# Patient Record
Sex: Male | Born: 1985
Health system: Southern US, Community
[De-identification: ages and names within clinical notes are randomized; demographics above are authoritative.]

## PROBLEM LIST (undated history)

## (undated) DIAGNOSIS — K219 Gastro-esophageal reflux disease without esophagitis: Secondary | ICD-10-CM

## (undated) DIAGNOSIS — R011 Cardiac murmur, unspecified: Secondary | ICD-10-CM

## (undated) HISTORY — DX: Cardiac murmur, unspecified: R01.1

## (undated) HISTORY — PX: CARDIAC SURGERY: SHX584

---
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
## 2011-09-21 ENCOUNTER — Ambulatory Visit (INDEPENDENT_AMBULATORY_CARE_PROVIDER_SITE_OTHER): Payer: 59

## 2011-09-21 DIAGNOSIS — R1084 Generalized abdominal pain: Secondary | ICD-10-CM

## 2011-09-21 DIAGNOSIS — Z23 Encounter for immunization: Secondary | ICD-10-CM

## 2011-09-21 DIAGNOSIS — N476 Balanoposthitis: Secondary | ICD-10-CM

## 2012-02-14 ENCOUNTER — Ambulatory Visit (INDEPENDENT_AMBULATORY_CARE_PROVIDER_SITE_OTHER): Payer: 59 | Admitting: Emergency Medicine

## 2012-02-14 VITALS — BP 118/81 | HR 77 | Temp 98.3°F | Resp 17 | Ht 64.5 in | Wt 277.0 lb

## 2012-02-14 DIAGNOSIS — N476 Balanoposthitis: Secondary | ICD-10-CM

## 2012-02-14 DIAGNOSIS — Z9889 Other specified postprocedural states: Secondary | ICD-10-CM

## 2012-02-14 DIAGNOSIS — Z8774 Personal history of (corrected) congenital malformations of heart and circulatory system: Secondary | ICD-10-CM | POA: Insufficient documentation

## 2012-02-14 DIAGNOSIS — N481 Balanitis: Secondary | ICD-10-CM

## 2012-02-14 MED ORDER — KETOCONAZOLE 2 % EX CREA: TOPICAL_CREAM | CUTANEOUS | Status: DC

## 2012-02-14 NOTE — Progress Notes (Signed)
  Subjective:    Patient ID: Jonathan Cunningham, male    DOB: Feb 10, 1986, 26 y.o.   MRN: [id number]  HPI patient enters with this recurrent rash in his foreskin that did not respond to Lotrimin cream.    Review of Systems he has no history of diabetes     Objective:   Physical Exam he is an uncircumcised male. His foreskin is thickened and scaly and cracked.  KOH was positive for hyphae      Assessment & Plan:  Patient with significant balanitis. We'll alternate use of nizoral cream with Lubriderm.

## 2013-02-12 ENCOUNTER — Ambulatory Visit (INDEPENDENT_AMBULATORY_CARE_PROVIDER_SITE_OTHER): Payer: BC Managed Care – PPO | Admitting: Emergency Medicine

## 2013-02-12 VITALS — BP 111/73 | HR 71 | Temp 98.7°F | Resp 16 | Ht 65.5 in | Wt 283.0 lb

## 2013-02-12 DIAGNOSIS — Z23 Encounter for immunization: Secondary | ICD-10-CM

## 2013-02-12 DIAGNOSIS — H6993 Unspecified Eustachian tube disorder, bilateral: Secondary | ICD-10-CM

## 2013-02-12 DIAGNOSIS — Z0000 Encounter for general adult medical examination without abnormal findings: Secondary | ICD-10-CM

## 2013-02-12 MED ORDER — AZELASTINE HCL 0.1 % NA SOLN: 2.0000 | Freq: Two times a day (BID) | NASAL | Status: DC

## 2013-02-12 MED ORDER — FLUTICASONE PROPIONATE 50 MCG/ACT NA SUSP: 2.0000 | Freq: Every day | NASAL | Status: DC

## 2013-02-12 NOTE — Progress Notes (Signed)
  Subjective:    Patient ID: Jonathan Cunningham, male    DOB: [birth year], 27 y.o.   MRN: [id number]  HPI 27 year old presents with bilateral ear fullness, itching decreased hearing x 5-6 months; progressively worsening; patient assumed it was allergy related when patient uses Q-tips he sees blood on cotton.     Review of Systems     Objective:   Physical Exam is slight amount of fluid in the right middle ear. There is scarring of the drum. Examination of the left ear reveals a previous adipose patch. Nose is normal throat clear chest clear to        Assessment & Plan:  We'll treat as eustachian tube dysfunction. He is to be on Flonase and Astelin. He will continue his Zyrtec one a day. He is to let him know if he continues to have problems

## 2013-10-07 ENCOUNTER — Ambulatory Visit: Payer: BC Managed Care – PPO

## 2013-10-07 ENCOUNTER — Ambulatory Visit (INDEPENDENT_AMBULATORY_CARE_PROVIDER_SITE_OTHER): Payer: BC Managed Care – PPO | Admitting: Emergency Medicine

## 2013-10-07 VITALS — BP 120/70 | HR 77 | Temp 98.1°F | Resp 16 | Ht 64.5 in | Wt 286.0 lb

## 2013-10-07 DIAGNOSIS — M79609 Pain in unspecified limb: Secondary | ICD-10-CM

## 2013-10-07 DIAGNOSIS — M25571 Pain in right ankle and joints of right foot: Secondary | ICD-10-CM

## 2013-10-07 DIAGNOSIS — M79671 Pain in right foot: Secondary | ICD-10-CM

## 2013-10-07 DIAGNOSIS — M25579 Pain in unspecified ankle and joints of unspecified foot: Secondary | ICD-10-CM

## 2013-10-07 DIAGNOSIS — Z23 Encounter for immunization: Secondary | ICD-10-CM

## 2013-10-07 NOTE — Progress Notes (Addendum)
   Subjective:    Patient ID: Jonathan Cunningham, male    DOB: 12/10/1985, 28 y.o.   MRN: [id number]  HPI This chart was scribed for Lesle ChrisSteven Kionna Brier, MD by Andrew Auaven Small, ED Scribe. This patient was seen in room 5 and the patient's care was started at 10:45 AM.  HPI Comments: Jonathan Cunningham is a 28 y.o. male who presents to the [facility] complaining of inner right ankle pain onset 1 month. Pt states that the pain worsens when he stands on his feet. Pt denies gout and swollen toes. 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 He reports that he has been working this job for about 4 weeks and he has been exerting himself more than usual. Pt has just purchased a new pair of work shoes and reports that he has been wearing an ankle brace with minor relief.  Pt is also requesting a flu shot.   Past Medical History  Diagnosis Date  . Heart murmur    No Known Allergies Prior to Admission medications   Medication Sig Start Date End Date Taking? Authorizing Provider  azelastine (ASTELIN) 137 MCG/SPRAY nasal spray Place 2 sprays into the nose 2 (two) times daily. Use in each nostril as directed 02/12/13   Collene GobbleSteven A Zahira Brummond, MD  fluticasone (FLONASE) 50 MCG/ACT nasal spray Place 2 sprays into the nose daily. 02/12/13   Collene GobbleSteven A Chanoch Mccleery, MD  ketoconazole (NIZORAL) 2 % cream Apply to the foreskin twice a day 02/14/12   Collene GobbleSteven A Jeffren Dombek, MD   Review of Systems  Musculoskeletal: Positive for arthralgias ( right ankle).  Skin: Negative for rash and wound.       Objective:   Physical Exam CONSTITUTIONAL: Well developed/well nourished HEAD: Normocephalic/atraumatic EYES: EOMI/PERRL ENMT: Mucous membranes moist NEURO: Pt is awake/alert, moves all extremitiesx4 EXTREMITIES: pulses normal, full ROM there is tenderness over the dorsum of the right foot. There is pain with inversion but primarily pain with dorsi flexion. Pulses are 2+ and symmetrical  circulation is intact SKIN: warm, color normal PSYCH: no abnormalities of mood noted [facility] reading (PRIMARY) by  Dr. Cleta Albertsaub no fractures are seen.      Assessment & Plan:  Will place patient in a Swede-O along with increased support with Scholl's  gel pad.. I personally performed the services described in this documentation, which was scribed in my presence. The recorded information has been reviewed and is accurate.

## 2014-06-11 ENCOUNTER — Ambulatory Visit (INDEPENDENT_AMBULATORY_CARE_PROVIDER_SITE_OTHER): Payer: BC Managed Care – PPO

## 2014-06-11 ENCOUNTER — Ambulatory Visit (INDEPENDENT_AMBULATORY_CARE_PROVIDER_SITE_OTHER): Payer: BC Managed Care – PPO | Admitting: Internal Medicine

## 2014-06-11 VITALS — BP 118/78 | HR 82 | Temp 98.5°F | Resp 18 | Ht 65.0 in | Wt 299.0 lb

## 2014-06-11 DIAGNOSIS — Z23 Encounter for immunization: Secondary | ICD-10-CM

## 2014-06-11 DIAGNOSIS — R0789 Other chest pain: Secondary | ICD-10-CM

## 2014-06-11 DIAGNOSIS — S29011A Strain of muscle and tendon of front wall of thorax, initial encounter: Secondary | ICD-10-CM

## 2014-06-11 MED ORDER — METHOCARBAMOL 750 MG PO TABS: 750.0000 mg | ORAL_TABLET | Freq: Four times a day (QID) | ORAL | Status: DC

## 2014-06-11 NOTE — Progress Notes (Signed)
   Subjective:    Patient ID: Jonathan Cunningham, male    DOB: 01/20/1986, 28 y.o.   MRN: [id number]  HPI 28 yr old Caucasian male is here today with complaints of right side rib pain that started on Monday. He states that he lifts a lot at his current job. Denies chest pain, sob, wheezing, appendectomy, cholecycsectomy. He states he just wants to make sure enough is okay.  Hx of tetralogy of fallott repair as child. No abdominal pain, urine sxs, hematuria, no Nor V. No cough, fever or fatigue. He states pain started end of day while lifting something. PHx of shingles in same area, no rash now Review of Systems     Objective:   Physical Exam  Constitutional: He is oriented to person, place, and time. He appears well-developed and well-nourished. No distress.  HENT:  Head: Normocephalic.  Mouth/Throat: Oropharynx is clear and moist.  Eyes: EOM are normal. No scleral icterus.  Neck: Normal range of motion. Neck supple.  Cardiovascular: Normal rate and regular rhythm.   Murmur heard. Pulmonary/Chest: Effort normal and breath sounds normal. No respiratory distress. He has no wheezes. He exhibits tenderness.  Abdominal: Soft. Bowel sounds are normal. He exhibits no mass. There is no tenderness.  Musculoskeletal: Normal range of motion. He exhibits tenderness. He exhibits no edema.  Lymphadenopathy:    He has no cervical adenopathy.  Neurological: He is alert and oriented to person, place, and time. He exhibits normal muscle tone. Coordination normal.  Skin: No rash noted.  Psychiatric: He has a normal mood and affect. His behavior is normal. Judgment and thought content normal.    [facility] reading (PRIMARY) by  Dr Perrin MalteseGuest normal cxr and ribs        Assessment & Plan:  Right chest wall strain/pain Robaxin/motrin

## 2014-06-11 NOTE — Patient Instructions (Addendum)
Influenza Vaccine (Flu Vaccine, Inactivated or Recombinant) 2014-2015: What You Need to Know 1. Why get vaccinated? Influenza ("flu") is a contagious disease that spreads around the [place] every winter, usually between October and May. Flu is caused by influenza viruses, and is spread mainly by coughing, sneezing, and close contact. Anyone can get flu, but the risk of getting flu is highest among children. Symptoms come on suddenly and may last several days. They can include:  fever/chills  sore throat  muscle aches  fatigue  cough  headache  runny or stuffy nose Flu can make some people much sicker than others. These people include young children, people 65 and older, pregnant women, and people with certain health conditions-such as heart, lung or kidney disease, nervous system disorders, or a weakened immune system. Flu vaccination is especially important for these people, and anyone in close contact with them. Flu can also lead to pneumonia, and make existing medical conditions worse. It can cause diarrhea and seizures in children. Each year thousands of people in the [place] die from flu, and many more are hospitalized. Flu vaccine is the best protection against flu and its complications. Flu vaccine also helps prevent spreading flu from person to person. 2. Inactivated and recombinant flu vaccines You are getting an injectable flu vaccine, which is either an "inactivated" or "recombinant" vaccine. These vaccines do not contain any live influenza virus. They are given by injection with a needle, and often called the "flu shot."  A different live, attenuated (weakened) influenza vaccine is sprayed into the nostrils. This vaccine is described in a separate Vaccine Information Statement. Flu vaccination is recommended every year. Some children 6 months through 8 years of age might need two doses during one year. Flu viruses are always changing. Each year's flu vaccine is made  to protect against 3 or 4 viruses that are likely to cause disease that year. Flu vaccine cannot prevent all cases of flu, but it is the best defense against the disease.  It takes about 2 weeks for protection to develop after the vaccination, and protection lasts several months to a year. Some illnesses that are not caused by influenza virus are often mistaken for flu. Flu vaccine will not prevent these illnesses. It can only prevent influenza. Some inactivated flu vaccine contains a very small amount of a mercury-based preservative called thimerosal. Studies have shown that thimerosal in vaccines is not harmful, but flu vaccines that do not contain a preservative are available. 3. Some people should not get this vaccine Tell the person who gives you the vaccine:  If you have any severe, life-threatening allergies. If you ever had a life-threatening allergic reaction after a dose of flu vaccine, or have a severe allergy to any part of this vaccine, including (for example) an allergy to gelatin, antibiotics, or eggs, you may be advised not to get vaccinated. Most, but not all, types of flu vaccine contain a small amount of egg protein.  If you ever had Guillain-Barr Syndrome (a severe paralyzing illness, also called GBS). Some people with a history of GBS should not get this vaccine. This should be discussed with your doctor.  If you are not feeling well. It is usually okay to get flu vaccine when you have a mild illness, but you might be advised to wait until you feel better. You should come back when you are better. 4. Risks of a vaccine reaction With a vaccine, like any medicine, there is a chance of side   effects. These are usually mild and go away on their own. Problems that could happen after any vaccine:  Brief fainting spells can happen after any medical procedure, including vaccination. Sitting or lying down for about 15 minutes can help prevent fainting, and injuries caused by a fall. Tell  your doctor if you feel dizzy, or have vision changes or ringing in the ears.  Severe shoulder pain and reduced range of motion in the arm where a shot was given can happen, very rarely, after a vaccination.  Severe allergic reactions from a vaccine are very rare, estimated at less than 1 in a million doses. If one were to occur, it would usually be within a few minutes to a few hours after the vaccination. Mild problems following inactivated flu vaccine:  soreness, redness, or swelling where the shot was given  hoarseness  sore, red or itchy eyes  cough  fever  aches  headache  itching  fatigue If these problems occur, they usually begin soon after the shot and last 1 or 2 days. Moderate problems following inactivated flu vaccine:  Young children who get inactivated flu vaccine and pneumococcal vaccine (PCV13) at the same time may be at increased risk for seizures caused by fever. Ask your doctor for more information. Tell your doctor if a child who is getting flu vaccine has ever had a seizure. Inactivated flu vaccine does not contain live flu virus, so you cannot get the flu from this vaccine. As with any medicine, there is a very remote chance of a vaccine causing a serious injury or death. The safety of vaccines is always being monitored. For more information, visit: www.cdc.gov/vaccinesafety/ 5. What if there is a serious reaction? What should I look for?  Look for anything that concerns you, such as signs of a severe allergic reaction, very high fever, or behavior changes. Signs of a severe allergic reaction can include hives, swelling of the face and throat, difficulty breathing, a fast heartbeat, dizziness, and weakness. These would start a few minutes to a few hours after the vaccination. What should I do?  If you think it is a severe allergic reaction or other emergency that can't wait, call 9-1-1 and get the person to the nearest hospital. Otherwise, call your  doctor.  Afterward, the reaction should be reported to the Vaccine Adverse Event Reporting System (VAERS). Your doctor should file this report, or you can do it yourself through the VAERS web site at www.vaers.hhs.gov, or by calling 1-800-822-7967. VAERS does not give medical advice. 6. The National Vaccine Injury Compensation Program The National Vaccine Injury Compensation Program (VICP) is a federal program that was created to compensate people who may have been injured by certain vaccines. Persons who believe they may have been injured by a vaccine can learn about the program and about filing a claim by calling 1-800-338-2382 or visiting the VICP website at www.hrsa.gov/vaccinecompensation. There is a time limit to file a claim for compensation. 7. How can I learn more?  Ask your health care provider.  Call your local or state health department.  Contact the Centers for Disease Control and Prevention (CDC):  Call 1-800-232-4636 (1-800-CDC-INFO) or  Visit CDC's website at www.cdc.gov/flu CDC Vaccine Information Statement (Interim) Inactivated Influenza Vaccine (04/24/2013) Document Released: 06/17/2006 Document Revised: 01/07/2014 Document Reviewed: 08/10/2013 ExitCare Patient Information 2015 [employer]. This information is not intended to replace advice given to you by your health care provider. Make sure you discuss any questions you have with your health   care provider. Chest Wall Pain Chest wall pain is pain in or around the bones and muscles of your chest. It may take up to 6 weeks to get better. It may take longer if you must stay physically active in your work and activities.  CAUSES  Chest wall pain may happen on its own. However, it may be caused by: A viral illness like the flu. Injury. Coughing. Exercise. Arthritis. Fibromyalgia. Shingles. HOME CARE INSTRUCTIONS  Avoid overtiring physical activity. Try not to strain or perform activities that cause pain. This  includes any activities using your chest or your abdominal and side muscles, especially if heavy weights are used. Put ice on the sore area. Put ice in a plastic bag. Place a towel between your skin and the bag. Leave the ice on for 15-20 minutes per hour while awake for the first 2 days. Only take over-the-counter or prescription medicines for pain, discomfort, or fever as directed by your caregiver. SEEK IMMEDIATE MEDICAL CARE IF:  Your pain increases, or you are very uncomfortable. You have a fever. Your chest pain becomes worse. You have new, unexplained symptoms. You have nausea or vomiting. You feel sweaty or lightheaded. You have a cough with phlegm (sputum), or you cough up blood. MAKE SURE YOU:  Understand these instructions. Will watch your condition. Will get help right away if you are not doing well or get worse. Document Released: 08/23/2005 Document Revised: 11/15/2011 Document Reviewed: 04/19/2011 [facility] Patient Information 2015 [place]. This information is not intended to replace advice given to you by your health care provider. Make sure you discuss any questions you have with your health care provider.

## 2014-11-16 ENCOUNTER — Encounter (HOSPITAL_COMMUNITY): Payer: Self-pay | Admitting: Emergency Medicine

## 2014-11-16 ENCOUNTER — Ambulatory Visit (INDEPENDENT_AMBULATORY_CARE_PROVIDER_SITE_OTHER): Payer: BLUE CROSS/BLUE SHIELD | Admitting: Emergency Medicine

## 2014-11-16 ENCOUNTER — Emergency Department (HOSPITAL_COMMUNITY)
Admission: EM | Admit: 2014-11-16 | Discharge: 2014-11-17 | Disposition: A | Discharge status: Home / Self Care | Payer: BLUE CROSS/BLUE SHIELD | Attending: Emergency Medicine | Admitting: Emergency Medicine

## 2014-11-16 VITALS — BP 98/72 | HR 88 | Temp 97.9°F | Resp 16 | Ht 64.0 in | Wt 294.4 lb

## 2014-11-16 DIAGNOSIS — R112 Nausea with vomiting, unspecified: Secondary | ICD-10-CM | POA: Diagnosis not present

## 2014-11-16 DIAGNOSIS — R011 Cardiac murmur, unspecified: Secondary | ICD-10-CM | POA: Insufficient documentation

## 2014-11-16 DIAGNOSIS — G43A0 Cyclical vomiting, not intractable: Secondary | ICD-10-CM | POA: Diagnosis not present

## 2014-11-16 DIAGNOSIS — R1084 Generalized abdominal pain: Secondary | ICD-10-CM

## 2014-11-16 DIAGNOSIS — R197 Diarrhea, unspecified: Secondary | ICD-10-CM

## 2014-11-16 DIAGNOSIS — R1115 Cyclical vomiting syndrome unrelated to migraine: Secondary | ICD-10-CM

## 2014-11-16 LAB — GLUCOSE, POCT (MANUAL RESULT ENTRY): POC Glucose: 91 mg/dl (ref 70–99)

## 2014-11-16 LAB — COMPREHENSIVE METABOLIC PANEL
ALBUMIN: 4.9 g/dL (ref 3.5–5.2)
ALK PHOS: 55 U/L (ref 39–117)
ALT: 35 U/L (ref 0–53)
AST: 30 U/L (ref 0–37)
Anion gap: 14 (ref 5–15)
BILIRUBIN TOTAL: 1.6 mg/dL — AB (ref 0.3–1.2)
BUN: 14 mg/dL (ref 6–23)
CHLORIDE: 107 mmol/L (ref 96–112)
CO2: 20 mmol/L (ref 19–32)
Calcium: 9.2 mg/dL (ref 8.4–10.5)
Creatinine, Ser: 0.71 mg/dL (ref 0.50–1.35)
GFR calc non Af Amer: >90 mL/min (ref >90)
Glucose, Bld: 121 mg/dL — ABNORMAL HIGH (ref 70–99)
POTASSIUM: 3.9 mmol/L (ref 3.5–5.1)
SODIUM: 141 mmol/L (ref 135–145)
TOTAL PROTEIN: 7.7 g/dL (ref 6.0–8.3)

## 2014-11-16 LAB — POCT CBC
Granulocyte percent: 89.6 %G — AB (ref 37–80)
HCT, POC: 45.8 % (ref 43.5–53.7)
Hemoglobin: 14.6 g/dL (ref 14.1–18.1)
Lymph, poc: 0.7 (ref 0.6–3.4)
MCH, POC: 27.9 pg (ref 27–31.2)
MCHC: 31.8 g/dL (ref 31.8–35.4)
MCV: 87.6 fL (ref 80–97)
MID (cbc): 0.7 (ref 0–0.9)
MPV: 8.2 fL (ref 0–99.8)
PLATELET COUNT, POC: 241 10*3/uL (ref 142–424)
POC Granulocyte: 12.4 — AB (ref 2–6.9)
POC LYMPH %: 5.4 % — AB (ref 10–50)
POC MID %: 5 %M (ref 0–12)
RBC: 5.23 M/uL (ref 4.69–6.13)
RDW, POC: 13.6 %
WBC: 13.8 10*3/uL — AB (ref 4.6–10.2)

## 2014-11-16 LAB — CBC WITH DIFFERENTIAL/PLATELET
Basophils Absolute: 0 10*3/uL (ref 0.0–0.1)
Basophils Relative: 0 % (ref 0–1)
EOS PCT: 0 % (ref 0–5)
Eosinophils Absolute: 0 10*3/uL (ref 0.0–0.7)
HEMATOCRIT: 44.2 % (ref 39.0–52.0)
Hemoglobin: 14.9 g/dL (ref 13.0–17.0)
LYMPHS ABS: 0.6 10*3/uL — AB (ref 0.7–4.0)
Lymphocytes Relative: 5 % — ABNORMAL LOW (ref 12–46)
MCH: 29.4 pg (ref 26.0–34.0)
MCHC: 33.7 g/dL (ref 30.0–36.0)
MCV: 87.4 fL (ref 78.0–100.0)
Monocytes Absolute: 0.6 10*3/uL (ref 0.1–1.0)
Monocytes Relative: 5 % (ref 3–12)
NEUTROS ABS: 10.1 10*3/uL — AB (ref 1.7–7.7)
NEUTROS PCT: 90 % — AB (ref 43–77)
PLATELETS: 275 10*3/uL (ref 150–400)
RBC: 5.06 MIL/uL (ref 4.22–5.81)
RDW: 13.5 % (ref 11.5–15.5)
WBC: 11.2 10*3/uL — ABNORMAL HIGH (ref 4.0–10.5)

## 2014-11-16 LAB — URINE MICROSCOPIC-ADD ON

## 2014-11-16 LAB — URINALYSIS, ROUTINE W REFLEX MICROSCOPIC
Bilirubin Urine: NEGATIVE
Glucose, UA: NEGATIVE mg/dL
Ketones, ur: NEGATIVE mg/dL
LEUKOCYTES UA: NEGATIVE
NITRITE: NEGATIVE
PH: 5 (ref 5.0–8.0)
PROTEIN: NEGATIVE mg/dL
Specific Gravity, Urine: 1.036 — ABNORMAL HIGH (ref 1.005–1.030)
Urobilinogen, UA: 0.2 mg/dL (ref 0.0–1.0)

## 2014-11-16 LAB — LIPASE, BLOOD: Lipase: 15 U/L (ref 11–59)

## 2014-11-16 MED ORDER — ONDANSETRON HCL 4 MG/2ML IJ SOLN
4.0000 mg | Freq: Once | INTRAMUSCULAR | Status: AC
  Administered 2014-11-16: 4 mg via INTRAVENOUS
  Filled 2014-11-16: qty 2

## 2014-11-16 MED ORDER — ACETAMINOPHEN 500 MG PO TABS: 500.0000 mg | ORAL_TABLET | Freq: Four times a day (QID) | ORAL | Status: AC | PRN

## 2014-11-16 MED ORDER — ONDANSETRON HCL 4 MG PO TABS: 4.0000 mg | ORAL_TABLET | Freq: Three times a day (TID) | ORAL | Status: DC | PRN

## 2014-11-16 MED ORDER — SODIUM CHLORIDE 0.9 % IV BOLUS (SEPSIS)
1000.0000 mL | Freq: Once | INTRAVENOUS | Status: AC
  Administered 2014-11-16: 1000 mL via INTRAVENOUS

## 2014-11-16 MED ORDER — ONDANSETRON 4 MG PO TBDP: 4.0000 mg | ORAL_TABLET | Freq: Once | ORAL | Status: DC

## 2014-11-16 MED ORDER — RANITIDINE HCL 150 MG PO TABS: 150.0000 mg | ORAL_TABLET | Freq: Once | ORAL | Status: DC

## 2014-11-16 NOTE — ED Notes (Addendum)
Awake. Verbally responsive. Resp even and unlabored. No audible adventitious breath sounds noted. ABC's intact. Abd soft/nondistended but tender to palpate. BS (+) and active x4 quadrants. Pt reported N/V x 20 and Diarrhea x6 in past 24 hrs. Pt reported having nausea without emesis at this time.

## 2014-11-16 NOTE — Patient Instructions (Signed)

## 2014-11-16 NOTE — Progress Notes (Signed)
Erroneous note. Unable to delete.

## 2014-11-16 NOTE — Progress Notes (Signed)
   Subjective:  This chart was scribed for Earl LitesSteve Price Lachapelle, MD by Elveria Risingimelie Horne, Medial Scribe. This patient was seen in room 5 and the patient's care was started at 8:09 AM.    Patient ID: Jonathan Cunningham, male    DOB: 10/27/1985, 29 y.o.   MRN: [id number] Chief Complaint  Patient presents with  . Emesis  . Diarrhea  . Nausea  . Abdominal Pain    HPI HPI Comments: Jonathan Cunningham is a 29 y.o. male who presents to the [facility] complaining of inability to stop vomiting, and diarrhea.  Patient Active Problem List   Diagnosis Date Noted  . Tetralogy of Fallot s/p repair 02/14/2012   Past Medical History  Diagnosis Date  . Heart murmur    History reviewed. No pertinent past surgical history. No Known Allergies Prior to Admission medications   Medication Sig Start Date End Date Taking? Authorizing Provider  azelastine (ASTELIN) 137 MCG/SPRAY nasal spray Place 2 sprays into the nose 2 (two) times daily. Use in each nostril as directed 02/12/13   Collene GobbleSteven A Ziyan Schoon, MD  cetirizine (ZYRTEC) 10 MG tablet Take 10 mg by mouth daily.    Historical Provider, MD  fluticasone (FLONASE) 50 MCG/ACT nasal spray Place 2 sprays into the nose daily. 02/12/13   Collene GobbleSteven A Becker Christopher, MD  ketoconazole (NIZORAL) 2 % cream Apply to the foreskin twice a day 02/14/12   Collene GobbleSteven A Croy Drumwright, MD  methocarbamol (ROBAXIN-750) 750 MG tablet Take 1 tablet (750 mg total) by mouth 4 (four) times daily. 06/11/14   Jonita Albeehris W Guest, MD   History   Social History  . Marital Status: Single    Spouse Name: N/A  . Number of Children: N/A  . Years of Education: N/A   Occupational History  . Not on file.   Social History Main Topics  . Smoking status: Never Smoker   . Smokeless tobacco: Never Used  . Alcohol Use: No  . Drug Use: No  . Sexual Activity: Not on file   Other Topics Concern  . Not on file   Social History Narrative      Review of Systems As above.    Objective:   Physical Exam  CONSTITUTIONAL:  Well developed/well nourished HEAD: Normocephalic/atraumatic EYES: EOMI/PERRL ENMT: Mucous membranes moist NECK: supple no meningeal signs SPINE/BACK:entire spine nontender CV: S1/S2 noted, no murmurs/rubs/gallops noted LUNGS: Lungs are clear to auscultation bilaterally, no apparent distress ABDOMEN: soft, nontender, no rebound or guarding, bowel sounds noted throughout abdomen GU:no cva tenderness NEURO: Pt is awake/alert/appropriate, moves all extremitiesx4.  No facial droop.   EXTREMITIES: pulses normal/equal, full ROM SKIN: warm, color normal PSYCH: no abnormalities of mood noted, alert and oriented to situation  Filed Vitals:   11/16/14 1249  BP: 98/72  Pulse: 88  Temp: 97.9 F (36.6 C)  TempSrc: Oral  Resp: 16  Height: 5\' 4"  (1.626 m)  Weight: 294 lb 6 oz (133.528 kg)  SpO2: 98%        Assessment & Plan:  Patient seen in room 5 with persistent nausea and vomiting. He was treated with IV fluids and by mouth Zofran. He continued to have nausea and vomiting. Patient sent to the emergency room for further evaluation IV fluids and consideration for other anti-emetics.I personally performed the services described in this documentation, which was scribed in my presence. The recorded information has been reviewed and is accurate.

## 2014-11-16 NOTE — ED Notes (Signed)
Awake. Verbally responsive. Resp even and unlabored. No audible adventitious breath sounds noted. ABC's intact. Abd soft/nondistended/nontender. BS (+) and active x4 quadrants. No N/V/D reported. IV infusing NS at 97399ml/hr without difficulty. Family at bedside.

## 2014-11-16 NOTE — ED Notes (Signed)
Pt was seen at [facility] earlier today, where pt received IV fluids due to N/V. Pt states he had fever of 99.0. Pt called his PCP and was told to come to ED if he was not feeling any better. Pt states he is still unable to keep anything down.

## 2014-11-16 NOTE — Progress Notes (Signed)
11/16/2014 at 3:14 PM  Jonathan Cunningham / DOB: [birth year] / MRN: [id number]  The patient has Tetralogy of Fallot s/p repair on his problem list.  SUBJECTIVE  Chief compalaint: Emesis; Diarrhea; Nausea; and Abdominal Pain   History of present illness: Jonathan Cunningham is 29 y.o. well appearing male presenting for nausea and ~20 episodes of vomiting that started at 7 am this morning. He complains of generalized abdominal discomfort and one episode of watery stool.  He reports eating at [facility] last night but does not this the food was contaminated.  He denies any sick contacts at this time. He denies focal abdominal tenderness, chest pain, palpitations and shortness of breath.     He  has a past medical history of Heart murmur.    He has a current medication list which includes the following prescription(s): cetirizine.  Jonathan Cunningham has No Known Allergies. He  reports that he has never smoked. He has never used smokeless tobacco. He reports that he does not drink alcohol or use illicit drugs. He  has no sexual activity history on file. The patient  has no past surgical history on file.  His family history includes Diabetes in his father.  Review of Systems  Constitutional: Positive for malaise/fatigue. Negative for fever, chills, weight loss and diaphoresis.  Respiratory: Negative for shortness of breath.   Cardiovascular: Negative for chest pain and palpitations.  Gastrointestinal: Positive for nausea, vomiting, abdominal pain (generalized) and diarrhea.  Musculoskeletal: Positive for myalgias.  Skin: Negative.   Neurological: Negative for headaches.    OBJECTIVE  His  height is  (1.626 m) and weight is 294 lb 6 oz (133.528 kg). His oral temperature is 97.9 F (36.6 C). His blood pressure is 98/72 and his pulse is 88. His respiration is 16 and oxygen saturation is 98%.  The patient's body mass index is 50.5 kg/(m^2).  Physical Exam  Vitals reviewed. Constitutional: He is oriented to  person, place, and time. He appears well-developed and well-nourished. No distress.  Cardiovascular: Normal rate.   Respiratory: Effort normal and breath sounds normal.  GI: Soft. Bowel sounds are normal. He exhibits no distension and no mass. There is no tenderness. There is no rebound, no guarding and negative Murphy's sign.  Neurological: He is alert and oriented to person, place, and time.  Skin: Skin is warm. No rash noted. He is diaphoretic (mildly). No erythema. No pallor.  Psychiatric: He has a normal mood and affect. His behavior is normal. Judgment and thought content normal.    Results for orders placed or performed in visit on 11/16/14 (from the past 24 hour(s))  POCT CBC     Status: Abnormal   Collection Time: 11/16/14  3:08 PM  Result Value Ref Range   WBC 13.8 (A) 4.6 - 10.2 K/uL   Lymph, poc 0.7 0.6 - 3.4   POC LYMPH PERCENT 5.4 (A) 10 - 50 %L   MID (cbc) 0.7 0 - 0.9   POC MID % 5.0 0 - 12 %M   POC Granulocyte 12.4 (A) 2 - 6.9   Granulocyte percent 89.6 (A) 37 - 80 %G   RBC 5.23 4.69 - 6.13 M/uL   Hemoglobin 14.6 14.1 - 18.1 g/dL   HCT, POC 91.4 78.2 - 53.7 %   MCV 87.6 80 - 97 fL   MCH, POC 27.9 27 - 31.2 pg   MCHC 31.8 31.8 - 35.4 g/dL   RDW, POC 95.6 %   Platelet Count, POC  241 142 - 424 K/uL   MPV 8.2 0 - 99.8 fL    ASSESSMENT & PLAN  Jonathan Cunningham was seen today for emesis, diarrhea, nausea and abdominal pain.  Diagnoses and all orders for this visit:  Non-intractable cyclical vomiting with nausea: unknown etiology but likely food poisoning or viral gastroenteritis.  Will provide symptomatic therapy and assess his ability to orally hydrate.   Orders: -     ondansetron (ZOFRAN-ODT) disintegrating tablet 4 mg; Take 1 tablet (4 mg total) by mouth once. -     ranitidine (ZANTAC) tablet 150 mg; Take 1 tablet (150 mg total) by mouth once. -     CBC -     Clostridia difficile PCR   The patient was advised to call or come back to clinic if he does not see an  improvement in symptoms, or worsens with the above plan.   Deliah BostonMichael [name], MHS, PA-C [facility] 11/16/2014 3:14 PM

## 2014-11-16 NOTE — ED Provider Notes (Signed)
CSN: [id number]     Arrival date & time 11/16/14  2042 History   First MD Initiated Contact with Patient 11/16/14 2056     Chief Complaint  Patient presents with  . Emesis  . Nausea     (Consider location/radiation/quality/duration/timing/severity/associated sxs/prior Treatment) HPI   29 year old male with history of tetralogy of fallot status post surgical repair who presents for evaluation of nausea vomiting diarrhea. Patient reports since this morning he has had a queasy sensation in his abdomen, has had multiple bouts of nonbloody nonbilious vomitus as well as loose stools. Stools is nonbloody, non-mucousy. He is unable to keep anything down. States he has vomit a couple to 20 bouts of vomitus and has had at least 6 bouts of diarrhea. No associated fever, chills, runny nose sneezing coughing, chest pain or shortness of breath, back pain or dysuria. He recently received amoxicillin antibiotic for 7 days for dental pain and has been off of it for the past 3 days. He was seen at the urgent care earlier today for the symptoms and felt it was related to gastroenteritis. Receiving IV fluid and antinausea medication. He was discharge and also report a stool sample was obtained to rule out C. difficile. He is back again because he is feeling nausea. Despite vomiting or diarrhea. He recall eating cookout last night but denies anyone else with similar symptoms. No prior history of gallbladder disease.  Past Medical History  Diagnosis Date  . Heart murmur    History reviewed. No pertinent past surgical history. Family History  Problem Relation Age of Onset  . Diabetes Father    History  Substance Use Topics  . Smoking status: Never Smoker   . Smokeless tobacco: Never Used  . Alcohol Use: No    Review of Systems  All other systems reviewed and are negative.     Allergies  Review of patient's allergies indicates no known allergies.  Home Medications   Prior to Admission medications    Medication Sig Start Date End Date Taking? Authorizing Provider  acetaminophen (TYLENOL) 500 MG tablet Take 1 tablet (500 mg total) by mouth every 6 (six) hours as needed. Patient taking differently: Take 500 mg by mouth every 6 (six) hours as needed for mild pain.  11/16/14  Yes Ofilia Neas, PA-C  cetirizine (ZYRTEC) 10 MG tablet Take 10 mg by mouth daily as needed for allergies.    Yes Historical Provider, MD  ondansetron (ZOFRAN) 4 MG tablet Take 1 tablet (4 mg total) by mouth every 8 (eight) hours as needed for nausea or vomiting. 11/16/14  Yes Ofilia Neas, PA-C   BP 117/66 mmHg  Pulse 99  Temp(Src) 98.3 F (36.8 C) (Oral)  Resp 16  SpO2 98% Physical Exam  Constitutional: He is oriented to person, place, and time. He appears well-developed and well-nourished. No distress.  HENT:  Head: Atraumatic.  Mouth/Throat: Oropharynx is clear and moist.  Eyes: Conjunctivae are normal.  Neck: Normal range of motion. Neck supple.  Cardiovascular: Normal rate and regular rhythm.   Pulmonary/Chest: Effort normal and breath sounds normal. No respiratory distress. He exhibits no tenderness.  Abdominal: Soft. Bowel sounds are normal. He exhibits no distension. There is no tenderness. There is no rebound and no guarding.  Negative Murphy sign, no pain at McBurney's point, no peritoneal signs.  Musculoskeletal: He exhibits no edema.  Neurological: He is alert and oriented to person, place, and time.  Skin: No rash noted.  Psychiatric: He has a normal  mood and affect.    ED Course  Procedures (including critical care time)  9:40 PM Patient here with nausea vomiting diarrhea, recent antibodies. On exam, no significant abdominal tenderness concerning for biliary disease or colitis. He does not appears dehydrated and nontoxic appearance. He reported that he has a C. difficile culture obtained at the urgent care already. At this time, we'll recheck labs, and provide IV fluid and antinausea  medication. We'll continue to monitor.  11:57 PM Although urine shows many bacteria, patient denies dysuria concerning for UTI. Urine culture sent  12:23 AM Patient stable to tolerates by mouth. He felt better. He will be discharged with antinausea medication. He is to follow-up with his PCP for further care. Return precautions discussed. Suspect viral gastroenteritis although patient will be checked for C. difficile by his PCP.  Labs Review Labs Reviewed  CBC WITH DIFFERENTIAL/PLATELET - Abnormal; Notable for the following:    WBC 11.2 (*)    Neutrophils Relative % 90 (*)    Neutro Abs 10.1 (*)    Lymphocytes Relative 5 (*)    Lymphs Abs 0.6 (*)    All other components within normal limits  COMPREHENSIVE METABOLIC PANEL - Abnormal; Notable for the following:    Glucose, Bld 121 (*)    Total Bilirubin 1.6 (*)    All other components within normal limits  URINALYSIS, ROUTINE W REFLEX MICROSCOPIC - Abnormal; Notable for the following:    APPearance TURBID (*)    Specific Gravity, Urine 1.036 (*)    Hgb urine dipstick TRACE (*)    All other components within normal limits  URINE MICROSCOPIC-ADD ON - Abnormal; Notable for the following:    Bacteria, UA MANY (*)    Casts GRANULAR CAST (*)    All other components within normal limits  LIPASE, BLOOD    Imaging Review No results found.   EKG Interpretation None      MDM   Final diagnoses:  Nausea vomiting and diarrhea    BP 134/77 mmHg  Pulse 97  Temp(Src) 98.3 F (36.8 C) (Oral)  Resp 18  SpO2 99%  I have reviewed nursing notes and vital signs. I personally reviewed the imaging tests through PACS system  I reviewed available ER/hospitalization records thought the EMR     Fayrene HelperBowie Gurpreet Mariani, PA-C 11/17/14 0024  Nelva Nayobert Beaton, MD 11/17/14 [phone]

## 2014-11-17 LAB — COMPLETE METABOLIC PANEL WITH GFR
ALK PHOS: 52 U/L (ref 39–117)
ALT: 28 U/L (ref 0–53)
AST: 22 U/L (ref 0–37)
Albumin: 4.7 g/dL (ref 3.5–5.2)
BUN: 14 mg/dL (ref 6–23)
CHLORIDE: 106 meq/L (ref 96–112)
CO2: 24 mEq/L (ref 19–32)
Calcium: 9.1 mg/dL (ref 8.4–10.5)
Creat: 0.72 mg/dL (ref 0.50–1.35)
GFR, Est African American: >89 mL/min
GFR, Est Non African American: >89 mL/min
GLUCOSE: 91 mg/dL (ref 70–99)
Potassium: 4.4 mEq/L (ref 3.5–5.3)
SODIUM: 142 meq/L (ref 135–145)
Total Bilirubin: 1.2 mg/dL (ref 0.2–1.2)
Total Protein: 6.9 g/dL (ref 6.0–8.3)

## 2014-11-17 MED ORDER — PROMETHAZINE HCL 25 MG PO TABS: 25.0000 mg | ORAL_TABLET | Freq: Four times a day (QID) | ORAL | Status: DC | PRN

## 2014-11-17 NOTE — ED Notes (Signed)
Pt denies n/v after po challenge.

## 2014-11-17 NOTE — ED Notes (Signed)
Awake. Verbally responsive. Resp even and unlabored. No audible adventitious breath sounds noted. ABC's intact. Abd soft/nondistended/nontender. BS (+) and active x4 quadrants. No N/V/D reported. 

## 2014-11-17 NOTE — ED Notes (Signed)
Awake. Verbally responsive. A/O x4. Resp even and unlabored. No audible adventitious breath sounds noted. ABC's intact.  

## 2014-11-17 NOTE — ED Notes (Signed)
Pt given [place] for po fld challenge.

## 2014-11-17 NOTE — Discharge Instructions (Signed)

## 2014-11-18 LAB — CLOSTRIDIUM DIFFICILE BY PCR: Toxigenic C. Difficile by PCR: NOT DETECTED

## 2014-11-19 LAB — URINE CULTURE: Colony Count: >=100000

## 2014-11-20 NOTE — Progress Notes (Signed)
ED Antimicrobial Stewardship Positive Culture Follow Up   Julaine HuaShane W Gerwig is an 29 y.o. male who presented [facility] with a chief complaint of N/V Chief Complaint  Patient presents with  . Emesis  . Nausea    Recent Results (from the past 720 hour(s))  Stool culture     Status: None (Preliminary result)   Collection Time: 11/16/14  3:33 PM  Result Value Ref Range Status   Preliminary Report No Suspicious Colonies, Continuing to Hold  Preliminary  Clostridium Difficile by PCR     Status: None   Collection Time: 11/16/14  3:35 PM  Result Value Ref Range Status   C difficile by pcr Not Detected Not Detected Final    Comment: This test is for use only with liquid or soft stools; performance characteristics of other clinical specimen types have not been established.   This assay was performed by Cepheid GeneXpert(R) PCR. The performance characteristics of this assay have been determined by [facility]. Performance characteristics refer to the analytical performance of the test.   Urine culture     Status: None   Collection Time: 11/16/14  9:32 PM  Result Value Ref Range Status   Specimen Description URINE, CLEAN CATCH  Final   Special Requests NONE  Final   Colony Count   Final    >=100,000 COLONIES/ML Performed at [facility]    Culture   Final    KLEBSIELLA PNEUMONIAE Performed at [facility]    Report Status 11/19/2014 FINAL  Final   Organism ID, Bacteria KLEBSIELLA PNEUMONIAE  Final      Susceptibility   Klebsiella pneumoniae - MIC*    AMPICILLIN >=32 RESISTANT Resistant     CEFAZOLIN <=4 SENSITIVE Sensitive     CEFTRIAXONE <=1 SENSITIVE Sensitive     CIPROFLOXACIN <=0.25 SENSITIVE Sensitive     GENTAMICIN <=1 SENSITIVE Sensitive     LEVOFLOXACIN <=0.12 SENSITIVE Sensitive     NITROFURANTOIN 32 SENSITIVE Sensitive     TOBRAMYCIN <=1 SENSITIVE Sensitive     TRIMETH/SULFA <=20 SENSITIVE Sensitive     PIP/TAZO <=4 SENSITIVE  Sensitive     * KLEBSIELLA PNEUMONIAE    [x]  No treatment needed  28 YOM who presented with N/V/D - deemed viral gastroenteritis. The patient did not complain of urinary symptoms - no treatment needed at this time.   New antibiotic prescription: No treatment needed  ED Provider: Trixie DredgeEmily West, PA-C  Rolley SimsMartin, Ojani Berenson Ann 11/20/2014, 12:07 PM Infectious Diseases Pharmacist Phone# [phone]

## 2014-11-21 LAB — STOOL CULTURE

## 2014-11-24 ENCOUNTER — Telehealth (HOSPITAL_COMMUNITY): Payer: Self-pay

## 2014-11-24 NOTE — Telephone Encounter (Signed)
Post ED Visit - Positive Culture Follow-up  Culture report reviewed by antimicrobial stewardship pharmacist: []  [name], Pharm.D., BCPS []  Celedonio MiyamotoJeremy Frens, Pharm.D., BCPS [x]  Georgina PillionElizabeth Martin, Pharm.D., BCPS []  CamdenMinh Pham, [address]Ph[address]D., BCPS, AAHIVP []  Estella HuskMichelle Turner, Pharm.D., BCPS, AAHIVP []  Elder CyphersLorie Poole, [address]Pharm[address]D., BCPS  Positive Urine culture, >/= 100,000 colonies -> Klebsiella Pneumoniae No treatment needed.   Arvid RightClark, [name] 11/24/2014, 8:00 PM

## 2015-02-08 ENCOUNTER — Encounter (HOSPITAL_COMMUNITY): Payer: Self-pay | Admitting: Emergency Medicine

## 2015-02-08 ENCOUNTER — Emergency Department (HOSPITAL_COMMUNITY)
Admission: EM | Admit: 2015-02-08 | Discharge: 2015-02-08 | Disposition: A | Discharge status: Home / Self Care | Payer: BLUE CROSS/BLUE SHIELD | Attending: Emergency Medicine | Admitting: Emergency Medicine

## 2015-02-08 DIAGNOSIS — Z79899 Other long term (current) drug therapy: Secondary | ICD-10-CM | POA: Diagnosis not present

## 2015-02-08 DIAGNOSIS — R040 Epistaxis: Secondary | ICD-10-CM | POA: Diagnosis present

## 2015-02-08 DIAGNOSIS — R011 Cardiac murmur, unspecified: Secondary | ICD-10-CM | POA: Diagnosis not present

## 2015-02-08 DIAGNOSIS — R067 Sneezing: Secondary | ICD-10-CM | POA: Insufficient documentation

## 2015-02-08 MED ORDER — ACETAMINOPHEN 325 MG PO TABS
650.0000 mg | ORAL_TABLET | Freq: Once | ORAL | Status: AC
  Administered 2015-02-08: 650 mg via ORAL
  Filled 2015-02-08: qty 2

## 2015-02-08 MED ORDER — OXYMETAZOLINE HCL 0.05 % NA SOLN
1.0000 | Freq: Once | NASAL | Status: AC
  Administered 2015-02-08: 1 via NASAL
  Filled 2015-02-08: qty 15

## 2015-02-08 NOTE — ED Notes (Signed)
Awake. Verbally responsive. A/O x4. Resp even and unlabored. No audible adventitious breath sounds noted. ABC's intact.  

## 2015-02-08 NOTE — ED Provider Notes (Signed)
CSN: [id number]     Arrival date & time 02/08/15  0604 History   First MD Initiated Contact with Patient 02/08/15 [phone]     Chief Complaint  Patient presents with  . Epistaxis     (Consider location/radiation/quality/duration/timing/severity/associated sxs/prior Treatment) Patient is a 29 y.o. male presenting with nosebleeds. The history is provided by the patient.  Epistaxis Location:  L nare Severity:  Mild Duration:  3 hours Timing:  Constant Progression:  Worsening Chronicity:  New Context: not trauma   Relieved by:  Nothing Worsened by:  Nothing tried Ineffective treatments:  None tried Associated symptoms: sinus pain and sneezing   Associated symptoms: no fever     Past Medical History  Diagnosis Date  . Heart murmur    Past Surgical History  Procedure Laterality Date  . Cardiac surgery     Family History  Problem Relation Age of Onset  . Diabetes Father    History  Substance Use Topics  . Smoking status: Never Smoker   . Smokeless tobacco: Never Used  . Alcohol Use: No    Review of Systems  Constitutional: Negative for fever.  HENT: Positive for nosebleeds and sneezing.   All other systems reviewed and are negative.     Allergies  Review of patient's allergies indicates no known allergies.  Home Medications   Prior to Admission medications   Medication Sig Start Date End Date Taking? Authorizing Provider  cetirizine (ZYRTEC) 10 MG tablet Take 10 mg by mouth daily as needed for allergies.    Yes Historical Provider, MD  DENTA 5000 PLUS 1.1 % CREA dental cream Place 1 application onto teeth at bedtime.  01/29/15  Yes Historical Provider, MD  guaiFENesin (ROBITUSSIN) 100 MG/5ML liquid Take by mouth 3 (three) times daily as needed for cough or congestion.   Yes Historical Provider, MD  acetaminophen (TYLENOL) 500 MG tablet Take 1 tablet (500 mg total) by mouth every 6 (six) hours as needed. Patient not taking: Reported on 02/08/2015 11/16/14   Ofilia NeasMichael L  [name], PA-C  ondansetron (ZOFRAN) 4 MG tablet Take 1 tablet (4 mg total) by mouth every 8 (eight) hours as needed for nausea or vomiting. Patient not taking: Reported on 02/08/2015 11/16/14   Ofilia NeasMichael L [name], PA-C  promethazine (PHENERGAN) 25 MG tablet Take 1 tablet (25 mg total) by mouth every 6 (six) hours as needed for nausea. Patient not taking: Reported on 02/08/2015 11/17/14   Fayrene HelperBowie Tran, PA-C   BP 143/89 mmHg  Pulse 89  Temp(Src) 98.1 F (36.7 C) (Oral)  Resp 20  SpO2 99% Physical Exam  Constitutional: He is oriented to person, place, and time. He appears well-developed and well-nourished.  Non-toxic appearance. No distress.  HENT:  Head: Normocephalic and atraumatic.  Minimal bleeding from left nares, no visible bleeding site  Eyes: Conjunctivae, EOM and lids are normal. Pupils are equal, round, and reactive to light.  Neck: Normal range of motion. Neck supple. No tracheal deviation present. No thyroid mass present.  Cardiovascular: Normal rate, regular rhythm and normal heart sounds.  Exam reveals no gallop.   No murmur heard. Pulmonary/Chest: Effort normal and breath sounds normal. No stridor. No respiratory distress. He has no decreased breath sounds. He has no wheezes. He has no rhonchi. He has no rales.  Abdominal: Soft. Normal appearance and bowel sounds are normal. He exhibits no distension. There is no tenderness. There is no rebound and no CVA tenderness.  Musculoskeletal: Normal range of motion. He exhibits no edema or  tenderness.  Neurological: He is alert and oriented to person, place, and time. He has normal strength. No cranial nerve deficit or sensory deficit. GCS eye subscore is 4. GCS verbal subscore is 5. GCS motor subscore is 6.  Skin: Skin is warm and dry. No abrasion and no rash noted.  Psychiatric: He has a normal mood and affect. His speech is normal and behavior is normal.  Nursing note and vitals reviewed.   ED Course  EPISTAXIS MANAGEMENT Date/Time:  02/08/2015 7:41 AM Performed by: Lorre Nick Authorized by: Lorre Nick Consent: Verbal consent obtained. Patient sedated: no Treatment site: left posterior Repair method: nasal balloon Post-procedure assessment: bleeding stopped Treatment complexity: simple Patient tolerance: Patient tolerated the procedure well with no immediate complications   (including critical care time) Labs Review Labs Reviewed - No data to display  Imaging Review No results found.   EKG Interpretation None      MDM   Final diagnoses:  None    Bleeding controlled, pt stable for d/c    Lorre Nick, MD 02/08/15 [phone]

## 2015-02-08 NOTE — ED Notes (Signed)
Pt having active epitaxis from lt nare. Resp even and unlabored. ABC's intact. Dr. Freida BusmanAllen at bedside.

## 2015-02-08 NOTE — Discharge Instructions (Signed)
Have the packing removed in 24 hours   Nosebleed Nosebleeds can be caused by many conditions, including trauma, infections, polyps, foreign bodies, dry mucous membranes or climate, medicines, and air conditioning. Most nosebleeds occur in the front of the nose. Because of this location, most nosebleeds can be controlled by pinching the nostrils gently and continuously for at least 10 to 20 minutes. The long, continuous pressure allows enough time for the blood to clot. If pressure is released during that 10 to 20 minute time period, the process may have to be started again. The nosebleed may stop by itself or quit with pressure, or it may need concentrated heating (cautery) or pressure from packing. HOME CARE INSTRUCTIONS   If your nose was packed, try to maintain the pack inside until your health care provider removes it. If a gauze pack was used and it starts to fall out, gently replace it or cut the end off. Do not cut if a balloon catheter was used to pack the nose. Otherwise, do not remove unless instructed.  Avoid blowing your nose for 12 hours after treatment. This could dislodge the pack or clot and start the bleeding again.  If the bleeding starts again, sit up and bend forward, gently pinching the front half of your nose continuously for 20 minutes.  If bleeding was caused by dry mucous membranes, use over-the-counter saline nasal spray or gel. This will keep the mucous membranes moist and allow them to heal. If you must use a lubricant, choose the water-soluble variety. Use it only sparingly and not within several hours of lying down.  Do not use petroleum jelly or mineral oil, as these may drip into the lungs and cause serious problems.  Maintain humidity in your home by using less air conditioning or by using a humidifier.  Do not use aspirin or medicines which make bleeding more likely. Your health care provider can give you recommendations on this.  Resume normal activities as you  are able, but try to avoid straining, lifting, or bending at the waist for several days.  If the nosebleeds become recurrent and the cause is unknown, your health care provider may suggest laboratory tests. SEEK MEDICAL CARE IF: You have a fever. SEEK IMMEDIATE MEDICAL CARE IF:   Bleeding recurs and cannot be controlled.  There is unusual bleeding from or bruising on other parts of the body.  Nosebleeds continue.  There is any worsening of the condition which originally brought you in.  You become light-headed, feel faint, become sweaty, or vomit blood. MAKE SURE YOU:   Understand these instructions.  Will watch your condition.  Will get help right away if you are not doing well or get worse. Document Released: 06/02/2005 Document Revised: 01/07/2014 Document Reviewed: 07/24/2009 [facility] Patient Information 2015 [employer] [place]. This information is not intended to replace advice given to you by your health care provider. Make sure you discuss any questions you have with your health care provider.

## 2015-02-08 NOTE — ED Notes (Addendum)
Pt form home c/o nosebleed x 20 min. Blood noted from left nare. Denies blood thinner use or hypertension.  Pt reports sneezing and coughing for the past few days that he has been treating with OTC meds.

## 2015-02-08 NOTE — ED Notes (Signed)
Bed: WA04 Expected date:  Expected time:  Means of arrival:  Comments: 

## 2015-02-09 ENCOUNTER — Ambulatory Visit (INDEPENDENT_AMBULATORY_CARE_PROVIDER_SITE_OTHER): Payer: BLUE CROSS/BLUE SHIELD | Admitting: Emergency Medicine

## 2015-02-09 VITALS — BP 112/80 | HR 82 | Temp 98.0°F | Ht 65.0 in | Wt 303.1 lb

## 2015-02-09 DIAGNOSIS — R040 Epistaxis: Secondary | ICD-10-CM | POA: Diagnosis not present

## 2015-02-09 DIAGNOSIS — J0100 Acute maxillary sinusitis, unspecified: Secondary | ICD-10-CM

## 2015-02-09 DIAGNOSIS — Q213 Tetralogy of Fallot: Secondary | ICD-10-CM | POA: Diagnosis not present

## 2015-02-09 MED ORDER — AMOXICILLIN 875 MG PO TABS: 875.0000 mg | ORAL_TABLET | Freq: Two times a day (BID) | ORAL | Status: DC

## 2015-02-09 MED ORDER — ALPRAZOLAM 0.25 MG PO TABS: ORAL_TABLET | ORAL | Status: DC

## 2015-02-09 NOTE — Patient Instructions (Signed)

## 2015-02-09 NOTE — Progress Notes (Signed)
   Subjective:    Patient ID: Jonathan Cunningham, male    DOB: 10/18/1985, 29 y.o.   MRN: [id number]  HPI 29 year old male here for sinusitis and ED follow up. Pt was in ED yesterday with epistaxis. Had bleeding from left nostril due to dryness from allergies. He had nasal packing placed and wants it removed today. Has seasonal allergies this time of year. His allergies started to flare up two to three days ago. Has been sneezing a lot the day before yesterday.  Pt feels like he is getting a sinus infection after having packing placed yesterday. He started having sinus symptoms three to four days ago. Pt would like a refill of allergy medications. Pt will be referred to ENT to have nasal packing removed in another 24 hours.        Review of Systems patient has a history of tetralogy of flow repair. He has had no chest pain or cardiac symptoms. He has been asymptomatic from this.     Objective:   Physical Exam  Constitutional: He appears well-developed and well-nourished.  HENT:  Head: Normocephalic.  There is a nasal pack on the left side of the nose. There is no active bleeding. The posterior pharynx appears normal.  Cardiovascular: Normal rate and regular rhythm.   Pulmonary/Chest: No respiratory distress.          Assessment & Plan:  I am not going to remove his nasal balloon today. I placed him on amoxicillin to cover him for sinusitis. I did give him a low dose of Xanax to take until he has the pack removed tomorrow. We'll make a urgent referral for tomorrow the patient can have his nasal balloon removed.I personally performed the services described in this documentation, which was scribed in my presence. The recorded information has been reviewed and is accurate.  Earl LitesSteve Annalese Stiner, MD

## 2015-02-10 ENCOUNTER — Telehealth: Payer: Self-pay

## 2015-02-10 ENCOUNTER — Emergency Department (HOSPITAL_COMMUNITY)
Admission: EM | Admit: 2015-02-10 | Discharge: 2015-02-10 | Disposition: A | Discharge status: Home / Self Care | Payer: BLUE CROSS/BLUE SHIELD | Attending: Emergency Medicine | Admitting: Emergency Medicine

## 2015-02-10 ENCOUNTER — Encounter (HOSPITAL_COMMUNITY): Payer: Self-pay | Admitting: Emergency Medicine

## 2015-02-10 DIAGNOSIS — Z9889 Other specified postprocedural states: Secondary | ICD-10-CM | POA: Diagnosis not present

## 2015-02-10 DIAGNOSIS — Z79899 Other long term (current) drug therapy: Secondary | ICD-10-CM | POA: Diagnosis not present

## 2015-02-10 DIAGNOSIS — Z792 Long term (current) use of antibiotics: Secondary | ICD-10-CM | POA: Diagnosis not present

## 2015-02-10 DIAGNOSIS — Z4800 Encounter for change or removal of nonsurgical wound dressing: Secondary | ICD-10-CM | POA: Diagnosis present

## 2015-02-10 DIAGNOSIS — R011 Cardiac murmur, unspecified: Secondary | ICD-10-CM | POA: Diagnosis not present

## 2015-02-10 NOTE — ED Notes (Signed)
Pt requests removal of left nasal tube, tube was placed Saturday morning to stop posterior epistaxis, pt instructed to leave tube in place for 48 hours.

## 2015-02-10 NOTE — ED Provider Notes (Signed)
CSN: [id number]     Arrival date & time 02/10/15  1154 History  This chart was scribed for non-physician practitioner, Catha Gosselin, working with Bethann Berkshire, MD by Richarda Overlie, ED Scribe. This patient was seen in room WTR9/WTR9 and the patient's care was started at 12:13 PM.     Chief Complaint  Patient presents with  . Nasal Tube Removal    The history is provided by the patient and a parent. No language interpreter was used.   HPI Comments: Jonathan Cunningham is a 29 y.o. male who presents to the Emergency Department for a left nasal packing tube removal that he had placed 2 days ago by his PCP. Pt states that he had the nasal tube placed for a nose bleed. Pt states that he was prescribed on amoxicillin that he started taking 2 days ago. Pt states he was also prescribed alprazolam which he has not taken. He states that he has had no issues with the nasal tube and is experiencing no pain currently. Pt reports no exacerbating factors at this time. He denies fever, chills, any bleeding in his right nare or back of his throat. Denies being on any anticoagulation medications.  Past Medical History  Diagnosis Date  . Heart murmur    Past Surgical History  Procedure Laterality Date  . Cardiac surgery     Family History  Problem Relation Age of Onset  . Diabetes Father    History  Substance Use Topics  . Smoking status: Never Smoker   . Smokeless tobacco: Never Used  . Alcohol Use: No    Review of Systems  Constitutional: Negative for fever and chills.  HENT: Negative for nosebleeds, postnasal drip and sinus pressure.    Allergies  Review of patient's allergies indicates no known allergies.  Home Medications   Prior to Admission medications   Medication Sig Start Date End Date Taking? Authorizing Provider  acetaminophen (TYLENOL) 500 MG tablet Take 1 tablet (500 mg total) by mouth every 6 (six) hours as needed. 11/16/14   Ofilia Neas, PA-C  ALPRAZolam Prudy Feeler) 0.25 MG  tablet 1 tablet every 4-6 hours as needed for anxiety and stress 02/09/15   Collene Gobble, MD  amoxicillin (AMOXIL) 875 MG tablet Take 1 tablet (875 mg total) by mouth 2 (two) times daily. 02/09/15   Collene Gobble, MD  cetirizine (ZYRTEC) 10 MG tablet Take 10 mg by mouth daily as needed for allergies.     Historical Provider, MD  DENTA 5000 PLUS 1.1 % CREA dental cream Place 1 application onto teeth at bedtime.  01/29/15   Historical Provider, MD  guaiFENesin (ROBITUSSIN) 100 MG/5ML liquid Take by mouth 3 (three) times daily as needed for cough or congestion.    Historical Provider, MD   BP 110/68 mmHg  Pulse 95  Temp(Src) 98.7 F (37.1 C) (Oral)  Resp 18  SpO2 96% Physical Exam  Constitutional: He is oriented to person, place, and time. He appears well-developed and well-nourished.  HENT:  Head: Normocephalic and atraumatic.  Nose: Nose normal. No mucosal edema, rhinorrhea, nose lacerations, sinus tenderness, nasal deformity, septal deviation or nasal septal hematoma. No epistaxis.  No active bleeding after packing removed.  Eyes: Right eye exhibits no discharge. Left eye exhibits no discharge.  Neck: Neck supple. No tracheal deviation present.  Cardiovascular: Normal rate.   Pulmonary/Chest: Effort normal. No respiratory distress.  Abdominal: He exhibits no distension.  Neurological: He is alert and oriented to person, place, and time.  Coordination normal.  Skin: Skin is warm and dry.  Psychiatric: He has a normal mood and affect.  Nursing note and vitals reviewed.   ED Course  FOREIGN BODY REMOVAL Date/Time: 02/10/2015 1:05 PM Performed by: Catha GosselinPATEL-MILLS, Haider Hornaday Authorized by: Catha GosselinPATEL-MILLS, Arilla Hice Consent: Verbal consent obtained. Risks and benefits: risks, benefits and alternatives were discussed Consent given by: patient Patient understanding: patient states understanding of the procedure being performed Patient consent: the patient's understanding of the procedure matches consent  given Procedure consent: procedure consent matches procedure scheduled Patient identity confirmed: verbally with patient Body area: nose Location details: left nostril Anesthesia method: No anesthesia was used. Local anesthetic: No Anesthesia was used. Patient sedated: no Patient restrained: no Patient cooperative: yes Localization method: visualized Removal mechanism: I was able to deflate the nasal tube and remove the packing by hand. Complexity: simple 1 objects recovered. Objects recovered: Left nasal packing. Post-procedure assessment: foreign body removed Patient tolerance: Patient tolerated the procedure well with no immediate complications Comments: Left nasal packing was filled with air. I was able to remove it with a 10 mL syringe. I pulled the nasal packing out slowly. There was minimal bleeding on the packing. There was also minimal bleeding post packing removal which stopped with compression.     DIAGNOSTIC STUDIES: Oxygen Saturation is 100% on RA, normal by my interpretation.    COORDINATION OF CARE: 12:17 PM Discussed treatment plan with pt at bedside and pt agreed to plan.  Labs Review Labs Reviewed - No data to display  Imaging Review No results found.   EKG Interpretation None      MDM   Final diagnoses:  Encounter for removal of nasal packing  Patient presents for nasal packing removal that was placed 2 days ago by his pcp. Nasal packing removed without any difficulty.  Patient is currently on amoxicillin and I discussed continuing taking the medication. He is to return to his PCP if he continues to have nosebleeds and verbally agrees with the plan.   I personally performed the services described in this documentation, which was scribed in my presence. The recorded information has been reviewed and is accurate.     Catha GosselinHanna Patel-Mills, PA-C 02/10/15 1348  Bethann BerkshireJoseph Zammit, MD 02/11/15 [phone]

## 2015-02-10 NOTE — Telephone Encounter (Signed)
Pt's mom's calling checking on status of this message. She would love to have a CB from Dr. Cleta Albertsaub. She would like to give him an update.

## 2015-02-10 NOTE — Telephone Encounter (Signed)
pts mom called and is frustrated that this balloon cannot be removed from son's nose,so he may lose his job??? Requesting call back from Dr Latrelle Dodrillaub   Best phone for mom is [phone]

## 2015-02-10 NOTE — Discharge Instructions (Signed)
° °  Dressing Change Take anabiotic as prescribed. A dressing is a material placed over wounds. It keeps the wound clean, dry, and protected from further injury. This provides an environment that favors wound healing.  BEFORE YOU BEGIN  Get your supplies together. Things you may need include:  Saline solution.  Flexible gauze dressing.  Medicated cream.  Tape.  Gloves.  Abdominal dressing pads.  Gauze squares.  Plastic bags.  Take pain medicine 30 minutes before the dressing change if you need it.  Take a shower before you do the first dressing change of the day. Use plastic wrap or a plastic bag to prevent the dressing from getting wet. REMOVING YOUR OLD DRESSING   Wash your hands with soap and water. Dry your hands with a clean towel.  Put on your gloves.  Remove any tape.  Carefully remove the old dressing. If the dressing sticks, you may dampen it with warm water to loosen it, or follow your caregiver's specific directions.  Remove any gauze or packing tape that is in your wound.  Take off your gloves.  Put the gloves, tape, gauze, or any packing tape into a plastic bag. CHANGING YOUR DRESSING  Open the supplies.  Take the cap off the saline solution.  Open the gauze package so that the gauze remains on the inside of the package.  Put on your gloves.  Clean your wound as told by your caregiver.  If you have been told to keep your wound dry, follow those instructions.  Your caregiver may tell you to do one or more of the following:  Pick up the gauze. Pour the saline solution over the gauze. Squeeze out the extra saline solution.  Put medicated cream or other medicine on your wound if you have been told to do so.  Put the solution soaked gauze only in your wound, not on the skin around it.  Pack your wound loosely or as told by your caregiver.  Put dry gauze on your wound.  Put abdominal dressing pads over the dry gauze if your wet gauze soaks  through.  Tape the abdominal dressing pads in place so they will not fall off. Do not wrap the tape completely around the affected part (arm, leg, abdomen).  Wrap the dressing pads with a flexible gauze dressing to secure it in place.  Take off your gloves. Put them in the plastic bag with the old dressing. Tie the bag shut and throw it away.  Keep the dressing clean and dry until your next dressing change.  Wash your hands. SEEK MEDICAL CARE IF:  Your skin around the wound looks red.  Your wound feels more tender or sore.  You see pus in the wound.  Your wound smells bad.  You have a fever.  Your skin around the wound has a rash that itches and burns.  You see black or yellow skin in your wound that was not there before.  You feel nauseous, throw up, and feel very tired. Document Released: 09/30/2004 Document Revised: 11/15/2011 Document Reviewed: 07/05/2011 [facility] Patient Information 2015 [employer] [place]. This information is not intended to replace advice given to you by your health care provider. Make sure you discuss any questions you have with your health care provider.

## 2015-02-11 NOTE — Telephone Encounter (Signed)
DR Daub--FYI --Sherron MondaySpoke with mom. Dr bates wanted to wait 5 days to take balloon out. He is on abx and is doing fine. Mom is really upset that they wanted him to keep that in 5 days, and she is insistent that he is able to take balloon out. So they went ahead and took it out. He hasn't been able to go to work because of this and he needed to return to work. She just wanted you to be aware of this.

## 2015-06-10 ENCOUNTER — Encounter: Payer: Self-pay | Admitting: Emergency Medicine

## 2016-01-07 DIAGNOSIS — Z9889 Other specified postprocedural states: Secondary | ICD-10-CM | POA: Diagnosis not present

## 2016-01-07 DIAGNOSIS — Z6841 Body Mass Index (BMI) 40.0 and over, adult: Secondary | ICD-10-CM | POA: Diagnosis not present

## 2016-01-07 DIAGNOSIS — Q213 Tetralogy of Fallot: Secondary | ICD-10-CM | POA: Diagnosis not present

## 2016-07-05 DIAGNOSIS — Z23 Encounter for immunization: Secondary | ICD-10-CM | POA: Diagnosis not present

## 2016-07-23 DIAGNOSIS — K625 Hemorrhage of anus and rectum: Secondary | ICD-10-CM | POA: Diagnosis not present

## 2016-07-23 DIAGNOSIS — K219 Gastro-esophageal reflux disease without esophagitis: Secondary | ICD-10-CM | POA: Diagnosis not present

## 2016-07-23 DIAGNOSIS — R140 Abdominal distension (gaseous): Secondary | ICD-10-CM | POA: Diagnosis not present

## 2016-07-27 DIAGNOSIS — K219 Gastro-esophageal reflux disease without esophagitis: Secondary | ICD-10-CM | POA: Diagnosis not present

## 2016-07-27 DIAGNOSIS — K625 Hemorrhage of anus and rectum: Secondary | ICD-10-CM | POA: Diagnosis not present

## 2016-07-27 DIAGNOSIS — R140 Abdominal distension (gaseous): Secondary | ICD-10-CM | POA: Diagnosis not present

## 2016-08-10 DIAGNOSIS — H60593 Other noninfective acute otitis externa, bilateral: Secondary | ICD-10-CM | POA: Diagnosis not present

## 2016-08-10 DIAGNOSIS — E669 Obesity, unspecified: Secondary | ICD-10-CM | POA: Diagnosis not present

## 2016-08-10 DIAGNOSIS — Q213 Tetralogy of Fallot: Secondary | ICD-10-CM | POA: Diagnosis not present

## 2016-08-10 DIAGNOSIS — H9011 Conductive hearing loss, unilateral, right ear, with unrestricted hearing on the contralateral side: Secondary | ICD-10-CM | POA: Diagnosis not present

## 2016-08-13 ENCOUNTER — Ambulatory Visit (INDEPENDENT_AMBULATORY_CARE_PROVIDER_SITE_OTHER): Payer: BLUE CROSS/BLUE SHIELD | Admitting: Urgent Care

## 2016-08-13 VITALS — BP 110/68 | HR 98 | Temp 98.7°F | Resp 18 | Ht 65.0 in | Wt 305.0 lb

## 2016-08-13 DIAGNOSIS — B9789 Other viral agents as the cause of diseases classified elsewhere: Secondary | ICD-10-CM | POA: Diagnosis not present

## 2016-08-13 DIAGNOSIS — R059 Cough, unspecified: Secondary | ICD-10-CM

## 2016-08-13 DIAGNOSIS — R0982 Postnasal drip: Secondary | ICD-10-CM | POA: Diagnosis not present

## 2016-08-13 DIAGNOSIS — R05 Cough: Secondary | ICD-10-CM

## 2016-08-13 DIAGNOSIS — J029 Acute pharyngitis, unspecified: Secondary | ICD-10-CM | POA: Diagnosis not present

## 2016-08-13 DIAGNOSIS — R011 Cardiac murmur, unspecified: Secondary | ICD-10-CM

## 2016-08-13 DIAGNOSIS — J069 Acute upper respiratory infection, unspecified: Secondary | ICD-10-CM | POA: Diagnosis not present

## 2016-08-13 DIAGNOSIS — Q213 Tetralogy of Fallot: Secondary | ICD-10-CM

## 2016-08-13 LAB — POCT RAPID STREP A (OFFICE): RAPID STREP A SCREEN: NEGATIVE

## 2016-08-13 MED ORDER — HYDROCODONE-HOMATROPINE 5-1.5 MG/5ML PO SYRP
5.0000 mL | ORAL_SOLUTION | Freq: Every evening | ORAL | 0 refills | Status: DC | PRN
Start: 2016-08-13 — End: 2019-04-25

## 2016-08-13 MED ORDER — BENZONATATE 100 MG PO CAPS: 100.0000 mg | ORAL_CAPSULE | Freq: Three times a day (TID) | ORAL | 0 refills | Status: DC | PRN

## 2016-08-13 NOTE — Patient Instructions (Addendum)
Upper Respiratory Infection, Adult Most upper respiratory infections (URIs) are a viral infection of the air passages leading to the lungs. A URI affects the nose, throat, and upper air passages. The most common type of URI is nasopharyngitis and is typically referred to as "the common cold." URIs run their course and usually go away on their own. Most of the time, a URI does not require medical attention, but sometimes a bacterial infection in the upper airways can follow a viral infection. This is called a secondary infection. Sinus and middle ear infections are common types of secondary upper respiratory infections. Bacterial pneumonia can also complicate a URI. A URI can worsen asthma and chronic obstructive pulmonary disease (COPD). Sometimes, these complications can require emergency medical care and may be life threatening. What are the causes? Almost all URIs are caused by viruses. A virus is a type of germ and can spread from one person to another. What increases the risk? You may be at risk for a URI if:  You smoke.  You have chronic heart or lung disease.  You have a weakened defense (immune) system.  You are very young or very old.  You have nasal allergies or asthma.  You work in crowded or poorly ventilated areas.  You work in health care facilities or schools. What are the signs or symptoms? Symptoms typically develop 2-3 days after you come in contact with a cold virus. Most viral URIs last 7-10 days. However, viral URIs from the influenza virus (flu virus) can last 14-18 days and are typically more severe. Symptoms may include:  Runny or stuffy (congested) nose.  Sneezing.  Cough.  Sore throat.  Headache.  Fatigue.  Fever.  Loss of appetite.  Pain in your forehead, behind your eyes, and over your cheekbones (sinus pain).  Muscle aches. How is this diagnosed? Your health care provider may diagnose a URI by:  Physical exam.  Tests to check that your  symptoms are not due to another condition such as:  Strep throat.  Sinusitis.  Pneumonia.  Asthma. How is this treated? A URI goes away on its own with time. It cannot be cured with medicines, but medicines may be prescribed or recommended to relieve symptoms. Medicines may help:  Reduce your fever.  Reduce your cough.  Relieve nasal congestion. Follow these instructions at home:  Take medicines only as directed by your health care provider.  Gargle warm saltwater or take cough drops to comfort your throat as directed by your health care provider.  Use a warm mist humidifier or inhale steam from a shower to increase air moisture. This may make it easier to breathe.  Drink enough fluid to keep your urine clear or pale yellow.  Eat soups and other clear broths and maintain good nutrition.  Rest as needed.  Return to work when your temperature has returned to normal or as your health care provider advises. You may need to stay home longer to avoid infecting others. You can also use a face mask and careful hand washing to prevent spread of the virus.  Increase the usage of your inhaler if you have asthma.  Do not use any tobacco products, including cigarettes, chewing tobacco, or electronic cigarettes. If you need help quitting, ask your health care provider. How is this prevented? The best way to protect yourself from getting a cold is to practice good hygiene.  Avoid oral or hand contact with people with cold symptoms.  Wash your hands often if contact   occurs. There is no clear evidence that vitamin C, vitamin E, echinacea, or exercise reduces the chance of developing a cold. However, it is always recommended to get plenty of rest, exercise, and practice good nutrition. Contact a health care provider if:  You are getting worse rather than better.  Your symptoms are not controlled by medicine.  You have chills.  You have worsening shortness of breath.  You have brown  or red mucus.  You have yellow or brown nasal discharge.  You have pain in your face, especially when you bend forward.  You have a fever.  You have swollen neck glands.  You have pain while swallowing.  You have white areas in the back of your throat. Get help right away if:  You have severe or persistent:  Headache.  Ear pain.  Sinus pain.  Chest pain.  You have chronic lung disease and any of the following:  Wheezing.  Prolonged cough.  Coughing up blood.  A change in your usual mucus.  You have a stiff neck.  You have changes in your:  Vision.  Hearing.  Thinking.  Mood. This information is not intended to replace advice given to you by your health care provider. Make sure you discuss any questions you have with your health care provider. Document Released: 02/16/2001 Document Revised: 04/25/2016 Document Reviewed: 11/28/2013 Elsevier Interactive Patient Education  2017 Elsevier Inc.     IF you received an x-ray today, you will receive an invoice from [facility]. Please contact [facility] at [phone] with questions or concerns regarding your invoice.   IF you received labwork today, you will receive an invoice from [facility]. Please contact [facility] at [phone] with questions or concerns regarding your invoice.   Our billing staff will not be able to assist you with questions regarding bills from these companies.  You will be contacted with the lab results as soon as they are available. The fastest way to get your results is to activate your My Chart account. Instructions are located on the last page of this paperwork. If you have not heard from us regarding the results in 2 weeks, please contact this office.     

## 2016-08-13 NOTE — Progress Notes (Addendum)
    MRN: [id number] DOB: 07/01/1986  Subjective:   Jonathan Cunningham is a 30 y.o. male presenting for chief complaint of Cough; Nasal Congestion; and Sore Throat  Reports 4 day history of sore throat, throat congestion, chest congestion, hoarseness, chills, sweats, intermittently productive cough, weakness. Cough elicits belly pain and is interrupting sleep. Has tried APAP, hot soups, drinking teas with minimal relief. Denies fever, chest pain, shob, wheezing, n/v, rashes. Take Zyrtec daily for allergies. Denies history of asthma. Denies smoking cigarettes.  Jonathan Cunningham has a current medication list which includes the following prescription(s): acetaminophen and cetirizine. Also has No Known Allergies.  Jonathan Cunningham  has a past medical history of Heart murmur. Also  has a past surgical history that includes Cardiac surgery.  Objective:   Vitals: BP 110/68 (BP Location: Right Arm, Patient Position: Sitting, Cuff Size: Large)   Pulse 98   Temp 98.7 F (37.1 C) (Oral)   Resp 18   Ht 5\' 5"  (1.651 m)   Wt (!) 305 lb (138.3 kg)   SpO2 99%   BMI 50.75 kg/m   Physical Exam  Constitutional: He is oriented to person, place, and time. He appears well-developed and well-nourished.  HENT:  TM's intact bilaterally, no effusions or erythema. Nasal turbinates pink and moist, nasal passages patent. No sinus tenderness. Oropharynx with thick streaks of post-nasal drainage but no exudates, mucous membranes moist, dentition in good repair.  Eyes: Right eye exhibits no discharge. Left eye exhibits no discharge.  Neck: Normal range of motion. Neck supple.  Cardiovascular: Normal rate, regular rhythm and intact distal pulses.  Exam reveals no gallop and no friction rub.   Murmur (Loud systolic murmur, best heard over LUSB (not new)) heard. Pulmonary/Chest: No respiratory distress. He has no wheezes. He has no rales.  Lymphadenopathy:    He has no cervical adenopathy.  Neurological: He is alert and oriented to person,  place, and time.   Results for orders placed or performed in visit on 08/13/16 (from the past 24 hour(s))  POCT rapid strep A     Status: None   Collection Time: 08/13/16  2:38 PM  Result Value Ref Range   Rapid Strep A Screen Negative Negative    Assessment and Plan :   1. Viral URI with cough 2. Sore throat 3. Post-nasal drainage 4. Cough - Likely viral in nature, advised supportive care. - If no improvement or symptoms do not resolve return to clinic in 3-5 days.  5. Tetralogy of Fallot 6. Heart murmur - Avoid Sudafed, decongestants.   Wallis BambergMario Gonzalo Waymire, PA-C [facility] [phone] 08/13/2016 2:01 PM

## 2017-08-08 ENCOUNTER — Encounter: Payer: Self-pay | Admitting: Physician Assistant

## 2017-08-08 ENCOUNTER — Ambulatory Visit: Payer: BLUE CROSS/BLUE SHIELD | Admitting: Physician Assistant

## 2017-08-08 ENCOUNTER — Other Ambulatory Visit: Payer: Self-pay

## 2017-08-08 VITALS — BP 114/76 | HR 100 | Temp 99.9°F | Resp 16 | Ht 65.0 in | Wt 306.0 lb

## 2017-08-08 DIAGNOSIS — R6889 Other general symptoms and signs: Secondary | ICD-10-CM | POA: Diagnosis not present

## 2017-08-08 DIAGNOSIS — J101 Influenza due to other identified influenza virus with other respiratory manifestations: Secondary | ICD-10-CM | POA: Diagnosis not present

## 2017-08-08 LAB — POC INFLUENZA A&B (BINAX/QUICKVUE)
INFLUENZA A, POC: POSITIVE — AB
INFLUENZA B, POC: NEGATIVE

## 2017-08-08 MED ORDER — OSELTAMIVIR PHOSPHATE 75 MG PO CAPS: 75.0000 mg | ORAL_CAPSULE | Freq: Two times a day (BID) | ORAL | 0 refills | Status: DC

## 2017-08-08 MED ORDER — BENZONATATE 100 MG PO CAPS: 100.0000 mg | ORAL_CAPSULE | Freq: Three times a day (TID) | ORAL | 0 refills | Status: DC | PRN

## 2017-08-08 NOTE — Patient Instructions (Addendum)
Please make sure you are hydrating well with water.   I would like you to take medication as prescribed. You can take tylenol for pain and fever.   Influenza, Adult Influenza ("the flu") is an infection in the lungs, nose, and throat (respiratory tract). It is caused by a virus. The flu causes many common cold symptoms, as well as a high fever and body aches. It can make you feel very sick. The flu spreads easily from person to person (is contagious). Getting a flu shot (influenza vaccination) every year is the best way to prevent the flu. Follow these instructions at home:  Take over-the-counter and prescription medicines only as told by your doctor.  Use a cool mist humidifier to add moisture (humidity) to the air in your home. This can make it easier to breathe.  Rest as needed.  Drink enough fluid to keep your pee (urine) clear or pale yellow.  Cover your mouth and nose when you cough or sneeze.  Wash your hands with soap and water often, especially after you cough or sneeze. If you cannot use soap and water, use hand sanitizer.  Stay home from work or school as told by your doctor. Unless you are visiting your doctor, try to avoid leaving home until your fever has been gone for 24 hours without the use of medicine.  Keep all follow-up visits as told by your doctor. This is important. How is this prevented?  Getting a yearly (annual) flu shot is the best way to avoid getting the flu. You may get the flu shot in late summer, fall, or winter. Ask your doctor when you should get your flu shot.  Wash your hands often or use hand sanitizer often.  Avoid contact with people who are sick during cold and flu season.  Eat healthy foods.  Drink plenty of fluids.  Get enough sleep.  Exercise regularly. Contact a doctor if:  You get new symptoms.  You have: ? Chest pain. ? Watery poop (diarrhea). ? A fever.  Your cough gets worse.  You start to have more mucus.  You feel  sick to your stomach (nauseous).  You throw up (vomit). Get help right away if:  You start to be short of breath or have trouble breathing.  Your skin or nails turn a bluish color.  You have very bad pain or stiffness in your neck.  You get a sudden headache.  You get sudden pain in your face or ear.  You cannot stop throwing up. This information is not intended to replace advice given to you by your health care provider. Make sure you discuss any questions you have with your health care provider. Document Released: 06/01/2008 Document Revised: 01/29/2016 Document Reviewed: 06/17/2015 Elsevier Interactive Patient Education  2017 [employer]     IF you received an x-ray today, you will receive an invoice from [facility]. Please contact [facility] at [phone] with questions or concerns regarding your invoice.   IF you received labwork today, you will receive an [facility] from [facility]. Please contact [facility] at [phone] with questions or concerns regarding your invoice.   Our billing staff will not be able to assist you with questions regarding bills from these companies.  You will be contacted with the lab results as soon as they are available. The fastest way to get your results is to activate your My Chart account. Instructions are located on the last page of this paperwork. If you have not heard from us  regarding the results in 2 weeks, please contact this office.

## 2017-08-08 NOTE — Progress Notes (Signed)
[facility] [address], [place] [address] [phone]  Date:  08/08/2017   Name:  Jonathan Cunningham   DOB:  12/01/1985   MRN:  [id number]  PCP:  Shade FloodGreene, Jeffrey R, MD    History of Present Illness:  Jonathan Cunningham is a 31 y.o. male patient who presents to PCP with  Chief Complaint  Patient presents with  . Sinusitis    x friday  . Laryngitis    loss voice     3 days of chills, sinus drainage, hoarseness, coughing mild.  He was having bodyaches.  He has not slept much.  He is taking tylenol and allergy pills.  He is hydrating well.  He has subjective fever and chills.     Patient Active Problem List   Diagnosis Date Noted  . Tetralogy of Fallot 02/09/2015  . Tetralogy of Fallot s/p repair 02/14/2012    Past Medical History:  Diagnosis Date  . Heart murmur     Past Surgical History:  Procedure Laterality Date  . CARDIAC SURGERY      Social History   Tobacco Use  . Smoking status: Never Smoker  . Smokeless tobacco: Never Used  Substance Use Topics  . Alcohol use: No    Alcohol/week: 0.0 oz  . Drug use: No    Family History  Problem Relation Age of Onset  . Diabetes Father     No Known Allergies  Medication list has been reviewed and updated.  Current Outpatient Medications on File Prior to Visit  Medication Sig Dispense Refill  . acetaminophen (TYLENOL) 500 MG tablet Take 1 tablet (500 mg total) by mouth every 6 (six) hours as needed. 30 tablet 0  . cetirizine (ZYRTEC) 10 MG tablet Take 10 mg by mouth daily as needed for allergies.     . benzonatate (TESSALON) 100 MG capsule Take 1-2 capsules (100-200 mg total) by mouth 3 (three) times daily as needed for cough. (Patient not taking: Reported on 08/08/2017) 40 capsule 0  . HYDROcodone-homatropine (HYCODAN) 5-1.5 MG/5ML syrup Take 5 mLs by mouth at bedtime as needed. (Patient not taking: Reported on 08/08/2017) 50 mL 0   No current facility-administered medications on file prior to visit.      ROS ROS otherwise unremarkable unless listed above.  Physical Examination: BP 114/76   Pulse 100   Temp 99.9 F (37.7 C) (Oral)   Resp 16   Ht 5\' 5"  (1.651 m)   Wt (!) 306 lb (138.8 kg)   SpO2 98%   BMI 50.92 kg/m  Ideal Body Weight: Weight in (lb) to have BMI = 25: 149.9  Physical Exam  Constitutional: He is oriented to person, place, and time. He appears well-developed and well-nourished. No distress.  HENT:  Head: Atraumatic.  Right Ear: Tympanic membrane, external ear and ear canal normal.  Left Ear: Tympanic membrane, external ear and ear canal normal.  Nose: Mucosal edema and rhinorrhea present. Right sinus exhibits no maxillary sinus tenderness and no frontal sinus tenderness. Left sinus exhibits no maxillary sinus tenderness and no frontal sinus tenderness.  Mouth/Throat: No uvula swelling. No oropharyngeal exudate, posterior oropharyngeal edema or posterior oropharyngeal erythema.  Eyes: Conjunctivae, EOM and lids are normal. Pupils are equal, round, and reactive to light. Right eye exhibits normal extraocular motion. Left eye exhibits normal extraocular motion.  Neck: Trachea normal and full passive range of motion without pain. No edema and no erythema present.  Cardiovascular: Normal rate.  Pulmonary/Chest: Effort normal.  No respiratory distress. He has no decreased breath sounds. He has no wheezes. He has no rhonchi.  Neurological: He is alert and oriented to person, place, and time.  Skin: Skin is warm and dry. He is not diaphoretic.  Psychiatric: He has a normal mood and affect. His behavior is normal.    Results for orders placed or performed in visit on 08/08/17  POC Influenza A&B(BINAX/QUICKVUE)  Result Value Ref Range   Influenza A, POC Positive (A) Negative   Influenza B, POC Negative Negative     Assessment and Plan: Jonathan Cunningham is a 31 y.o. male who is here today for cc of  Chief Complaint  Patient presents with  . Sinusitis    x friday  .  Laryngitis    loss voice  given health history, will treat for proceed with treatment. Influenza A - Plan: oseltamivir (TAMIFLU) 75 MG capsule, benzonatate (TESSALON) 100 MG capsule  Flu-like symptoms - Plan: POC Influenza A&B(BINAX/QUICKVUE)  Trena PlattStephanie Avenly Roberge, PA-C [facility] 12/6/201812:46 PM

## 2017-10-04 DIAGNOSIS — Z6841 Body Mass Index (BMI) 40.0 and over, adult: Secondary | ICD-10-CM | POA: Diagnosis not present

## 2017-10-04 DIAGNOSIS — Q213 Tetralogy of Fallot: Secondary | ICD-10-CM | POA: Diagnosis not present

## 2017-10-22 ENCOUNTER — Encounter: Payer: Self-pay | Admitting: Physician Assistant

## 2017-10-22 DIAGNOSIS — I370 Nonrheumatic pulmonary valve stenosis: Secondary | ICD-10-CM | POA: Insufficient documentation

## 2017-12-05 ENCOUNTER — Encounter: Payer: Self-pay | Admitting: Physician Assistant

## 2018-04-04 DIAGNOSIS — R194 Change in bowel habit: Secondary | ICD-10-CM | POA: Diagnosis not present

## 2018-04-04 DIAGNOSIS — K600 Acute anal fissure: Secondary | ICD-10-CM | POA: Diagnosis not present

## 2018-04-04 DIAGNOSIS — K625 Hemorrhage of anus and rectum: Secondary | ICD-10-CM | POA: Diagnosis not present

## 2018-07-24 DIAGNOSIS — Z23 Encounter for immunization: Secondary | ICD-10-CM | POA: Diagnosis not present

## 2018-12-12 ENCOUNTER — Other Ambulatory Visit: Payer: Self-pay

## 2018-12-12 ENCOUNTER — Ambulatory Visit
Admission: EM | Admit: 2018-12-12 | Discharge: 2018-12-12 | Disposition: A | Discharge status: Home / Self Care | Payer: BLUE CROSS/BLUE SHIELD | Attending: Physician Assistant | Admitting: Physician Assistant

## 2018-12-12 DIAGNOSIS — M545 Low back pain, unspecified: Secondary | ICD-10-CM

## 2018-12-12 HISTORY — DX: Gastro-esophageal reflux disease without esophagitis: K21.9

## 2018-12-12 MED ORDER — METHOCARBAMOL 500 MG PO TABS: 500.0000 mg | ORAL_TABLET | Freq: Two times a day (BID) | ORAL | 0 refills | Status: DC

## 2018-12-12 NOTE — ED Provider Notes (Signed)
[facility]    CSN: [id number] Arrival date & time: 12/12/18  1054       History   Chief Complaint Chief Complaint  Patient presents with  . Back Pain    HPI Jonathan Cunningham is a 33 y.o. male.   33 year old male comes in for 3-day history of left low back pain.  Patient states is a constant pain, with waxing and waning of pain.  Sometimes it can be a "pinching" sensation, usually happens when he is changing position, bending over.  Has some stinging sensation down the left leg, but states this has happened in the past when he works a 10-hour shift when standing.  He denies saddle anesthesia, loss of bladder or bowel control.  He did fall about a week ago, but states he landed on his right knee.  Does have some lifting and bending at work, but has tried to reduce that to a minimum since pain started.  Has been taking Tylenol with some relief.     Past Medical History:  Diagnosis Date  . GERD (gastroesophageal reflux disease)   . Heart murmur     Patient Active Problem List   Diagnosis Date Noted  . Pulmonary valve stenosis 10/22/2017  . Tetralogy of Fallot 02/09/2015  . Tetralogy of Fallot s/p repair 02/14/2012    Past Surgical History:  Procedure Laterality Date  . CARDIAC SURGERY         Home Medications    Prior to Admission medications   Medication Sig Start Date End Date Taking? Authorizing Provider  acetaminophen (TYLENOL) 500 MG tablet Take 1 tablet (500 mg total) by mouth every 6 (six) hours as needed. 11/16/14   Ofilia Neaslark, Michael L, PA-C  benzonatate (TESSALON) 100 MG capsule Take 1-2 capsules (100-200 mg total) by mouth 3 (three) times daily as needed for cough. 08/08/17   Trena PlattEnglish, Stephanie D, PA  cetirizine (ZYRTEC) 10 MG tablet Take 10 mg by mouth daily as needed for allergies.     [provider]  HYDROcodone-homatropine (HYCODAN) 5-1.5 MG/5ML syrup Take 5 mLs by mouth at bedtime as needed. Patient not taking: Reported on 08/08/2017  08/13/16   Wallis BambergMani, Mario, PA-C  methocarbamol (ROBAXIN) 500 MG tablet Take 1 tablet (500 mg total) by mouth 2 (two) times daily. 12/12/18   Belinda FisherYu, Rika Daughdrill V, PA-C  oseltamivir (TAMIFLU) 75 MG capsule Take 1 capsule (75 mg total) by mouth 2 (two) times daily. 08/08/17   Garnetta BuddyEnglish, Stephanie D, PA    Family History Family History  Problem Relation Age of Onset  . Diabetes Father     Social History Social History   Tobacco Use  . Smoking status: Never Smoker  . Smokeless tobacco: Never Used  Substance Use Topics  . Alcohol use: No    Alcohol/week: 0.0 standard drinks  . Drug use: No     Allergies   Patient has no known allergies.   Review of Systems Review of Systems  Reason unable to perform ROS: See HPI as above.     Physical Exam Triage Vital Signs ED Triage Vitals [12/12/18 1108]  Enc Vitals Group     BP (!) 143/92     Pulse Rate 84     Resp 20     Temp 98 F (36.7 C)     Temp Source Oral     SpO2 97 %     Weight      Height      Head Circumference  Peak Flow      Pain Score 5     Pain Loc      Pain Edu?      Excl. in GC?    No data found.  Updated Vital Signs BP (!) 143/92 (BP Location: Left Arm)   Pulse 84   Temp 98 F (36.7 C) (Oral)   Resp 20   SpO2 97%   Physical Exam Constitutional:      General: He is not in acute distress.    Appearance: He is well-developed. He is not diaphoretic.  HENT:     Head: Normocephalic and atraumatic.  Eyes:     Conjunctiva/sclera: Conjunctivae normal.     Pupils: Pupils are equal, round, and reactive to light.  Cardiovascular:     Rate and Rhythm: Normal rate and regular rhythm.     Heart sounds: Murmur present. No friction rub. No gallop.   Pulmonary:     Effort: Pulmonary effort is normal. No accessory muscle usage or respiratory distress.     Breath sounds: Normal breath sounds. No stridor. No decreased breath sounds, wheezing, rhonchi or rales.  Musculoskeletal:     Comments: No tenderness on palpation of  the spinous processes and back. Full range of motion of back and hips. Active flexion of left hip can trigger left lumbar back pain. Strength normal and equal bilaterally. Sensation intact and equal bilaterally. Negative straight leg raise.   Skin:    General: Skin is warm and dry.  Neurological:     Mental Status: He is alert and oriented to person, place, and time.    UC Treatments / Results  Labs (all labs ordered are listed, but only abnormal results are displayed) Labs Reviewed - No data to display  EKG None  Radiology No results found.  Procedures Procedures (including critical care time)  Medications Ordered in UC Medications - No data to display  Initial Impression / Assessment and Plan / UC Course  I have reviewed the triage vital signs and the nursing notes.  Pertinent labs & imaging results that were available during my care of the patient were reviewed by me and considered in my medical decision making (see chart for details).    Start NSAID as directed for pain and inflammation. Muscle relaxant as needed. Ice/heat compresses. Discussed with patient strain can take up to 3-4 weeks to resolve, but should be getting better each week. Return precautions given.   Final Clinical Impressions(s) / UC Diagnoses   Final diagnoses:  Acute left-sided low back pain without sciatica    ED Prescriptions    Medication Sig Dispense Auth. Provider   methocarbamol (ROBAXIN) 500 MG tablet Take 1 tablet (500 mg total) by mouth 2 (two) times daily. 20 tablet Threasa Alpha, [place] 12/12/18 1234

## 2018-12-12 NOTE — ED Triage Notes (Signed)
Pt c/o lower back pain radiating down lt leg x3 days. States fell a week ago and landed on his knee

## 2018-12-12 NOTE — Discharge Instructions (Signed)
Start ibuprofen 600-800mg  three times a day for 7-10 days. Robaxin as needed, this can make you drowsy, so do not take if you are going to drive, operate heavy machinery, or make important decisions. Ice/heat compresses as needed. This can take up to 3-4 weeks to completely resolve, but you should be feeling better each week. Follow up here or with PCP if symptoms worsen, changes for reevaluation. If experience numbness/tingling of the inner thighs, loss of bladder or bowel control, go to the emergency department for evaluation.

## 2019-04-17 DIAGNOSIS — Q213 Tetralogy of Fallot: Secondary | ICD-10-CM | POA: Diagnosis not present

## 2019-04-17 DIAGNOSIS — Z6841 Body Mass Index (BMI) 40.0 and over, adult: Secondary | ICD-10-CM | POA: Diagnosis not present

## 2019-04-17 DIAGNOSIS — N5089 Other specified disorders of the male genital organs: Secondary | ICD-10-CM | POA: Diagnosis not present

## 2019-04-18 DIAGNOSIS — N5089 Other specified disorders of the male genital organs: Secondary | ICD-10-CM | POA: Insufficient documentation

## 2019-04-25 ENCOUNTER — Other Ambulatory Visit: Payer: Self-pay

## 2019-04-25 ENCOUNTER — Encounter: Payer: Self-pay | Admitting: Family Medicine

## 2019-04-25 ENCOUNTER — Ambulatory Visit (INDEPENDENT_AMBULATORY_CARE_PROVIDER_SITE_OTHER): Payer: BLUE CROSS/BLUE SHIELD | Admitting: Family Medicine

## 2019-04-25 VITALS — BP 128/84 | HR 77 | Temp 98.2°F | Resp 16 | Wt 285.2 lb

## 2019-04-25 DIAGNOSIS — N5089 Other specified disorders of the male genital organs: Secondary | ICD-10-CM | POA: Diagnosis not present

## 2019-04-25 NOTE — Progress Notes (Signed)
Subjective:    Patient ID: Jonathan Cunningham, male    DOB: Jan 18, 1986, 33 y.o.   MRN: [id number]  HPI Jonathan Cunningham is a 33 y.o. male Presents today for: Chief Complaint  Patient presents with  . Mass    lump under left genital area 2-3 weeks now.once in a while gets a little pain in the genital area   Left genital lesion: Noticed 2-3 weeks ago on left - scrotal area. Not painful. May have been there before, but more noticeable recently.  No dysuria. No penile discharge. No external rash.  Able to achieve erection without difficulty. No hematospermia.  Fall in [place], back soreness at that time, but improved.   Seen by his cardiologist Dr. Theadore Nan 04/17/19 for follow up tetralogy of fallot - s/p repair 1992, catheter pulmonary valvuloplasty 2008. Doing well - no SBE prophylaxis.    Patient Active Problem List   Diagnosis Date Noted  . Pulmonary valve stenosis 10/22/2017  . Tetralogy of Fallot 02/09/2015  . Tetralogy of Fallot s/p repair 02/14/2012   Past Medical History:  Diagnosis Date  . GERD (gastroesophageal reflux disease)   . Heart murmur    Past Surgical History:  Procedure Laterality Date  . CARDIAC SURGERY     No Known Allergies Prior to Admission medications   Medication Sig Start Date End Date Taking? Authorizing Provider  acetaminophen (TYLENOL) 500 MG tablet Take 1 tablet (500 mg total) by mouth every 6 (six) hours as needed. 11/16/14  Yes Tereasa Coop, PA-C  cetirizine (ZYRTEC) 10 MG tablet Take 10 mg by mouth daily as needed for allergies.    Yes [provider]   Social History   Socioeconomic History  . Marital status: Single    Spouse name: Not on file  . Number of children: Not on file  . Years of education: Not on file  . Highest education level: Not on file  Occupational History  . Not on file  Social Needs  . Financial resource strain: Not on file  . Food insecurity    Worry: Not on file    Inability: Not on file  .  Transportation needs    Medical: Not on file    Non-medical: Not on file  Tobacco Use  . Smoking status: Never Smoker  . Smokeless tobacco: Never Used  Substance and Sexual Activity  . Alcohol use: No    Alcohol/week: 0.0 standard drinks  . Drug use: No  . Sexual activity: Not on file  Lifestyle  . Physical activity    Days per week: Not on file    Minutes per session: Not on file  . Stress: Not on file  Relationships  . Social Herbalist on phone: Not on file    Gets together: Not on file    Attends religious service: Not on file    Active member of club or organization: Not on file    Attends meetings of clubs or organizations: Not on file    Relationship status: Not on file  . Intimate partner violence    Fear of current or ex partner: Not on file    Emotionally abused: Not on file    Physically abused: Not on file    Forced sexual activity: Not on file  Other Topics Concern  . Not on file  Social History Narrative  . Not on file    Review of Systems Per HPI    Objective:   Physical  Exam Vitals signs reviewed.  Constitutional:      General: He is not in acute distress.    Appearance: He is well-developed. He is obese.  HENT:     Head: Normocephalic and atraumatic.  Cardiovascular:     Rate and Rhythm: Normal rate.  Pulmonary:     Effort: Pulmonary effort is normal.  Abdominal:     Hernia: A hernia is present. There is no hernia in the left inguinal area or right inguinal area.  Genitourinary:    Penis: Normal and uncircumcised. No discharge or lesions.      Scrotum/Testes:        Left: Mass (small firm nodular area approx 5mm on left lateral testis. nontender. ) present. Tenderness not present.     Epididymis:     Right: Normal. No tenderness.     Left: Normal. No tenderness.  Neurological:     Mental Status: He is alert and oriented to person, place, and time.    Vitals:   04/25/19 1145  BP: 128/84  Pulse: 77  Resp: 16  Temp: 98.2 F  (36.8 C)  TempSrc: Oral  SpO2: 98%  Weight: 285 lb 3.2 oz (129.4 kg)          Assessment & Plan:  Jonathan Cunningham is a 33 y.o. male Nodule of testis - Plan: US Scrotum, US PELVIC DOPPLER (TORSION R/O OR MASS ARTERIAL FLOW)  -Very small, possible nodular area left lateral testis.  Check ultrasound, then determine urology follow-up need  Scheduling physical next few months/establishing primary care with me.    No orders of the defined types were placed in this encounter.  Patient Instructions     I will order ultrasound, then can determine if urology appointment needed. Thanks for coming in today. Let me know if there are questions in the meantime.   If you have lab work done today you will be contacted with your lab results within the next 2 weeks.  If you have not heard from us then please contact us. The fastest way to get your results is to register for My Chart.   IF you received an x-ray today, you will receive an invoice from [facility]. Please contact [facility] at [phone] with questions or concerns regarding your invoice.   IF you received labwork today, you will receive an invoice from [facility]. Please contact [facility] at [phone] with questions or concerns regarding your invoice.   Our billing staff will not be able to assist you with questions regarding bills from these companies.  You will be contacted with the lab results as soon as they are available. The fastest way to get your results is to activate your My Chart account. Instructions are located on the last page of this paperwork. If you have not heard from us regarding the results in 2 weeks, please contact this office.       Signed,   Meredith StaggersJeffrey Hassie Mandt, MD Primary [facility] at [facility].  04/25/19 1:37 PM

## 2019-04-25 NOTE — Addendum Note (Signed)
Addended by: Merri Ray R on: 04/25/2019 02:10 PM   Modules accepted: Orders

## 2019-04-25 NOTE — Patient Instructions (Addendum)
   I will order ultrasound, then can determine if urology appointment needed. Thanks for coming in today. Let me know if there are questions in the meantime.   If you have lab work done today you will be contacted with your lab results within the next 2 weeks.  If you have not heard from [place] then please contact us. The fastest way to get your results is to register for My Chart.   IF you received an x-ray today, you will receive an invoice from [facility]. Please contact [facility] at [phone] with questions or concerns regarding your invoice.   IF you received labwork today, you will receive an invoice from [facility]. Please contact [facility] at [phone] with questions or concerns regarding your invoice.   Our billing staff will not be able to assist you with questions regarding bills from these companies.  You will be contacted with the lab results as soon as they are available. The fastest way to get your results is to activate your My Chart account. Instructions are located on the last page of this paperwork. If you have not heard from [place] regarding the results in 2 weeks, please contact this office.

## 2019-05-01 ENCOUNTER — Ambulatory Visit
Admission: RE | Admit: 2019-05-01 | Discharge: 2019-05-01 | Disposition: A | Discharge status: Home / Self Care | Payer: BC Managed Care – PPO | Source: Ambulatory Visit | Attending: Family Medicine | Admitting: Family Medicine

## 2019-05-01 DIAGNOSIS — I861 Scrotal varices: Secondary | ICD-10-CM | POA: Diagnosis not present

## 2019-05-01 DIAGNOSIS — N5089 Other specified disorders of the male genital organs: Secondary | ICD-10-CM

## 2019-05-02 ENCOUNTER — Telehealth: Payer: Self-pay | Admitting: Family Medicine

## 2019-05-02 NOTE — Telephone Encounter (Signed)
Copied from [place] [phone]. Topic: General - Other >> May 02, 2019  3:06 PM Yvette Rack wrote: Reason for CRM: Pt called in for ultrasound results. Pt requests call back.

## 2019-05-07 NOTE — Telephone Encounter (Signed)
Attempted to call the patient's mobile.  Left voicemail that I will reach him later.  Also tried home number -unavailable.  Answering service also called at [phone], asked them to connect me, but went straight to voicemail as well.  I will try to reach him later this evening.

## 2019-05-07 NOTE — Telephone Encounter (Signed)
Please review and I will give pt a call today.

## 2019-07-19 ENCOUNTER — Telehealth (INDEPENDENT_AMBULATORY_CARE_PROVIDER_SITE_OTHER): Payer: BC Managed Care – PPO | Admitting: Adult Health Nurse Practitioner

## 2019-07-19 ENCOUNTER — Other Ambulatory Visit: Payer: Self-pay

## 2019-07-19 ENCOUNTER — Encounter: Payer: Self-pay | Admitting: Adult Health Nurse Practitioner

## 2019-07-19 ENCOUNTER — Telehealth: Payer: Self-pay | Admitting: Family Medicine

## 2019-07-19 DIAGNOSIS — R197 Diarrhea, unspecified: Secondary | ICD-10-CM | POA: Diagnosis not present

## 2019-07-19 HISTORY — DX: Diarrhea, unspecified: R19.7

## 2019-07-19 NOTE — Telephone Encounter (Signed)
Pt says that the medication recommended reads on the box that may affect his heart condition   Please advise

## 2019-07-19 NOTE — Progress Notes (Signed)
Telemedicine Encounter- SOAP NOTE Established Patient  This telephone encounter was conducted with the patient's (or proxy's) verbal consent via audio telecommunications: yes/no: Yes Patient was instructed to have this encounter in a suitably private space; and to only have persons present to whom they give permission to participate. In addition, patient identity was confirmed by use of name plus two identifiers (DOB and address).  I discussed the limitations, risks, security and privacy concerns of performing an evaluation and management service by telephone and the availability of in person appointments. I also discussed with the patient that there may be a patient responsible charge related to this service. The patient expressed understanding and agreed to proceed.  I spent a total of TIME; 0 MIN TO 60 MIN: 20 minutes talking with the patient or their proxy.  Chief Complaint  Patient presents with  . Diarrhea    Pt stated --diarrhea --feel gassy, heart burn, pain lower abdominal for 2 days .Marland KitchenMarland Kitchenpt is on keto diet and cheated yesterday...ate bonjangles biscuit. Denied fever.    Subjective   Jonathan Cunningham is a 33 y.o. established patient. Telephone visit today for diarrhea  HPI   Patient reports that he has been on a keto diet.  Has a cheat day every week.  Yesterday, ate Bonjangles fried biscuit and hot salsa.  Developed diarrhea that started at 9 pm last night and has continued through this a.m.  He has tried Pepto without relief.  Also reports pain around the rectum from constant diarrhea.  Denies chills, fevers, night sweats.  NO blood in stool.  No loss of taste or smell.    Patient Active Problem List   Diagnosis Date Noted  . Diarrhea 07/19/2019  . Pulmonary valve stenosis 10/22/2017  . Tetralogy of Fallot 02/09/2015  . Tetralogy of Fallot s/p repair 02/14/2012    Past Medical History:  Diagnosis Date  . Diarrhea 07/19/2019  . GERD (gastroesophageal reflux disease)   .  Heart murmur     Current Outpatient Medications  Medication Sig Dispense Refill  . acetaminophen (TYLENOL) 500 MG tablet Take 1 tablet (500 mg total) by mouth every 6 (six) hours as needed. 30 tablet 0  . cetirizine (ZYRTEC) 10 MG tablet Take 10 mg by mouth daily as needed for allergies.      No current facility-administered medications for this visit.     No Known Allergies  Social History   Socioeconomic History  . Marital status: Single    Spouse name: Not on file  . Number of children: Not on file  . Years of education: Not on file  . Highest education level: Not on file  Occupational History  . Not on file  Social Needs  . Financial resource strain: Not on file  . Food insecurity    Worry: Not on file    Inability: Not on file  . Transportation needs    Medical: Not on file    Non-medical: Not on file  Tobacco Use  . Smoking status: Never Smoker  . Smokeless tobacco: Never Used  Substance and Sexual Activity  . Alcohol use: No    Alcohol/week: 0.0 standard drinks  . Drug use: No  . Sexual activity: Not on file  Lifestyle  . Physical activity    Days per week: Not on file    Minutes per session: Not on file  . Stress: Not on file  Relationships  . Social connections    Talks on phone: Not on file  Gets together: Not on file    Attends religious service: Not on file    Active member of club or organization: Not on file    Attends meetings of clubs or organizations: Not on file    Relationship status: Not on file  . Intimate partner violence    Fear of current or ex partner: Not on file    Emotionally abused: Not on file    Physically abused: Not on file    Forced sexual activity: Not on file  Other Topics Concern  . Not on file  Social History Narrative  . Not on file    ROS   Review of Systems See HPI Constitution: No fevers or chills No malaise No diaphoresis Skin: No rash or itching Eyes: no blurry vision, no double vision GU: no dysuria  or hematuria GI:  No contipation + for diarrhea, No melena.  Neuro: no dizziness or headaches   Objective    GEN: WDWN, NAD, Non-toxic, Alert & Oriented x 3  PSYCH: Normally interactive. Conversant. Not depressed or anxious appearing.  Calm demeanor.    Vitals as reported by the patient: Today's Vitals   07/19/19 0820  Weight: 281 lb (127.5 kg)  Height: 5\' 4"  (1.626 m)    Othar was seen today for diarrhea.  Diagnoses and all orders for this visit:  Diarrhea, unspecified type   Recommended OTC Immodium as well as rehydration with Pedialyte or Gatorade.  Needs to begin to eat slowly.  Currently eating bland foods. OTC Hydrocortisone 1% to rectal area for relief.  May use Prep H.  If symptoms continue or worsen, pt. To f/u. He is inline with this plna.    I discussed the assessment and treatment plan with the patient. The patient was provided an opportunity to ask questions and all were answered. The patient agreed with the plan and demonstrated an understanding of the instructions.   The patient was advised to call back or seek an in-person evaluation if the symptoms worsen or if the condition fails to improve as anticipated.  I provided 17 minutes of non-face-to-face time during this encounter.  Vincenza Hews, NP  Primary Care at [facility]

## 2019-07-21 ENCOUNTER — Encounter: Payer: Self-pay | Admitting: Emergency Medicine

## 2019-07-21 ENCOUNTER — Other Ambulatory Visit: Payer: Self-pay

## 2019-07-21 ENCOUNTER — Ambulatory Visit
Admission: EM | Admit: 2019-07-21 | Discharge: 2019-07-21 | Disposition: A | Discharge status: Home / Self Care | Payer: BC Managed Care – PPO

## 2019-07-21 DIAGNOSIS — R197 Diarrhea, unspecified: Secondary | ICD-10-CM | POA: Diagnosis not present

## 2019-07-21 MED ORDER — DICYCLOMINE HCL 20 MG PO TABS: 20.0000 mg | ORAL_TABLET | Freq: Two times a day (BID) | ORAL | 0 refills | Status: DC

## 2019-07-21 NOTE — ED Provider Notes (Signed)
[facility]    CSN: [id number] Arrival date & time: 07/21/19  1156      History   Chief Complaint Chief Complaint  Patient presents with  . Diarrhea    HPI Jonathan Cunningham is a 33 y.o. male.   33 year old male comes in for 4 day history of diarrhea. States first day had significant amounts of diarrhea with 10+ episodes of watery BM. Since then has about <5 episodes/day. Does have anal fissure, denies melena, hematochezia. Mild abdominal cramping, associated with bowel movement. Denies nausea/vomiting. Denies fever, chills, body aches. Denies URI symptoms such as cough, congestion, sore throat. Denies shortness of breath, loss of taste/smell. Denies weakness, dizziness, syncope. Has had immodium with temporary relief.      Past Medical History:  Diagnosis Date  . Diarrhea 07/19/2019  . GERD (gastroesophageal reflux disease)   . Heart murmur     Patient Active Problem List   Diagnosis Date Noted  . Diarrhea 07/19/2019  . Pulmonary valve stenosis 10/22/2017  . Tetralogy of Fallot 02/09/2015  . Tetralogy of Fallot s/p repair 02/14/2012    Past Surgical History:  Procedure Laterality Date  . CARDIAC SURGERY        Home Medications    Prior to Admission medications   Medication Sig Start Date End Date Taking? Authorizing Provider  cetirizine (ZYRTEC) 10 MG tablet Take 10 mg by mouth daily as needed for allergies.    Yes [provider]  Probiotic Product (PROBIOTIC-10 PO) Take by mouth.   Yes [provider]  acetaminophen (TYLENOL) 500 MG tablet Take 1 tablet (500 mg total) by mouth every 6 (six) hours as needed. 11/16/14   Tereasa Coop, PA-C  dicyclomine (BENTYL) 20 MG tablet Take 1 tablet (20 mg total) by mouth 2 (two) times daily. 07/21/19   Ok Edwards, PA-C    Family History Family History  Problem Relation Age of Onset  . Diabetes Father     Social History Social History   Tobacco Use  . Smoking status: Never Smoker   . Smokeless tobacco: Never Used  Substance Use Topics  . Alcohol use: No    Alcohol/week: 0.0 standard drinks  . Drug use: No     Allergies   Patient has no known allergies.   Review of Systems Review of Systems  Reason unable to perform ROS: See HPI as above.     Physical Exam Triage Vital Signs ED Triage Vitals  Enc Vitals Group     BP 07/21/19 1207 129/86     Pulse Rate 07/21/19 1207 86     Resp 07/21/19 1207 18     Temp 07/21/19 1207 98.4 F (36.9 C)     Temp Source 07/21/19 1207 Temporal     SpO2 07/21/19 1207 97 %     Weight --      Height --      Head Circumference --      Peak Flow --      Pain Score 07/21/19 1210 1     Pain Loc --      Pain Edu? --      Excl. in [place]? --    No data found.  Updated Vital Signs BP 129/86 (BP Location: Left Arm)   Pulse 86   Temp 98.4 F (36.9 C) (Temporal)   Resp 18   SpO2 97%   Physical Exam Constitutional:      General: He is not in acute distress.  Appearance: He is well-developed.  HENT:     Head: Normocephalic and atraumatic.  Cardiovascular:     Rate and Rhythm: Normal rate and regular rhythm.     Heart sounds: Murmur present. No friction rub. No gallop.   Pulmonary:     Effort: Pulmonary effort is normal.     Breath sounds: Normal breath sounds. No wheezing or rales.  Abdominal:     General: Bowel sounds are normal.     Palpations: Abdomen is soft.     Tenderness: There is no abdominal tenderness. There is no right CVA tenderness, left CVA tenderness, guarding or rebound.  Skin:    General: Skin is warm and dry.  Neurological:     Mental Status: He is alert and oriented to person, place, and time.  Psychiatric:        Behavior: Behavior normal.        Judgment: Judgment normal.      UC Treatments / Results  Labs (all labs ordered are listed, but only abnormal results are displayed) Labs Reviewed - No data to display  EKG   Radiology No results found.  Procedures Procedures  (including critical care time)  Medications Ordered in UC Medications - No data to display  Initial Impression / Assessment and Plan / UC Course  I have reviewed the triage vital signs and the nursing notes.  Pertinent labs & imaging results that were available during my care of the patient were reviewed by me and considered in my medical decision making (see chart for details).    No alarming signs on exam at this time.  Will start Bentyl to help with diarrhea.  Continue bland diet, advance as tolerated.  Push fluids.  Return precautions given.  Patient expresses understanding and agrees to plan.  Final Clinical Impressions(s) / UC Diagnoses   Final diagnoses:  Diarrhea, unspecified type    ED Prescriptions    Medication Sig Dispense Auth. Provider   dicyclomine (BENTYL) 20 MG tablet Take 1 tablet (20 mg total) by mouth 2 (two) times daily. 20 tablet Belinda Fisher, PA-C     PDMP not reviewed this encounter.   Belinda Fisher, PA-C 07/21/19 1546

## 2019-07-21 NOTE — ED Notes (Signed)
Patient able to ambulate independently  

## 2019-07-21 NOTE — ED Triage Notes (Signed)
Pt presents to [facility] for assessment of diarrhea x 4 day.  Patient states at the beginning he was awake for 24 hours having liquid diarrhea and a lot of gas.  Patient states it calmed down yesterday, and used two pills of imodium yesterday.  Pt states diarrhea has increased since the imodium wore off today.   Patient states it all started the day after he did his cheat day from his keto diet.  Pt has been doing keto since March.  Pt c/o a 1/10 abdominal cramping.  Denies emesis.

## 2019-07-21 NOTE — Discharge Instructions (Signed)
No alarming signs on exam. Bentyl to slow down bowel movements. Keep hydrated, you urine should be clear to pale yellow in color. Bland diet, advance as tolerated. Monitor for any worsening of symptoms, nausea or vomiting not controlled by medication, worsening abdominal pain, fever, go to the emergency department for further evaluation needed.

## 2019-07-26 NOTE — Telephone Encounter (Signed)
Called and spoke to pt about concerns, he states he is doing better at this time and has no other concerns.

## 2019-10-11 ENCOUNTER — Other Ambulatory Visit (HOSPITAL_COMMUNITY): Payer: Self-pay | Admitting: Gastroenterology

## 2019-10-11 ENCOUNTER — Other Ambulatory Visit: Payer: Self-pay | Admitting: Gastroenterology

## 2019-10-11 DIAGNOSIS — R1011 Right upper quadrant pain: Secondary | ICD-10-CM

## 2019-10-11 DIAGNOSIS — K219 Gastro-esophageal reflux disease without esophagitis: Secondary | ICD-10-CM | POA: Diagnosis not present

## 2019-10-23 ENCOUNTER — Encounter (HOSPITAL_COMMUNITY)
Admission: RE | Admit: 2019-10-23 | Discharge: 2019-10-23 | Disposition: A | Discharge status: Home / Self Care | Payer: BC Managed Care – PPO | Source: Ambulatory Visit | Attending: Gastroenterology | Admitting: Gastroenterology

## 2019-10-23 ENCOUNTER — Other Ambulatory Visit: Payer: Self-pay

## 2019-10-23 DIAGNOSIS — R1011 Right upper quadrant pain: Secondary | ICD-10-CM | POA: Insufficient documentation

## 2019-10-23 DIAGNOSIS — R109 Unspecified abdominal pain: Secondary | ICD-10-CM | POA: Diagnosis not present

## 2019-10-23 MED ORDER — TECHNETIUM TC 99M MEBROFENIN IV KIT
5.0000 | PACK | Freq: Once | INTRAVENOUS | Status: AC | PRN
  Administered 2019-10-23: 5 via INTRAVENOUS

## 2019-11-14 ENCOUNTER — Encounter: Payer: Self-pay | Admitting: Family Medicine

## 2019-11-14 ENCOUNTER — Ambulatory Visit (INDEPENDENT_AMBULATORY_CARE_PROVIDER_SITE_OTHER): Payer: BC Managed Care – PPO | Admitting: Family Medicine

## 2019-11-14 ENCOUNTER — Other Ambulatory Visit: Payer: Self-pay

## 2019-11-14 VITALS — BP 117/80 | HR 78 | Temp 98.0°F | Ht 65.5 in | Wt 292.0 lb

## 2019-11-14 DIAGNOSIS — Z1329 Encounter for screening for other suspected endocrine disorder: Secondary | ICD-10-CM

## 2019-11-14 DIAGNOSIS — Z0001 Encounter for general adult medical examination with abnormal findings: Secondary | ICD-10-CM

## 2019-11-14 DIAGNOSIS — Z131 Encounter for screening for diabetes mellitus: Secondary | ICD-10-CM

## 2019-11-14 DIAGNOSIS — Z6841 Body Mass Index (BMI) 40.0 and over, adult: Secondary | ICD-10-CM

## 2019-11-14 DIAGNOSIS — Z13220 Encounter for screening for lipoid disorders: Secondary | ICD-10-CM

## 2019-11-14 DIAGNOSIS — E669 Obesity, unspecified: Secondary | ICD-10-CM | POA: Insufficient documentation

## 2019-11-14 DIAGNOSIS — Z130 Encounter for screening for diseases of the blood and blood-forming organs and certain disorders involving the immune mechanism: Secondary | ICD-10-CM

## 2019-11-14 DIAGNOSIS — E7889 Other lipoprotein metabolism disorders: Secondary | ICD-10-CM

## 2019-11-14 DIAGNOSIS — E8889 Other specified metabolic disorders: Secondary | ICD-10-CM

## 2019-11-14 DIAGNOSIS — Z0000 Encounter for general adult medical examination without abnormal findings: Secondary | ICD-10-CM

## 2019-11-14 NOTE — Progress Notes (Signed)
Subjective:  Patient ID: Jonathan Cunningham, male    DOB: 02-13-1986  Age: 34 y.o. MRN: [id number]  CC:  Chief Complaint  Patient presents with  . Annual Exam    pt states he feels good aside from some heartburn from brakfast. pt states he was concerned about his gulblader is at 50% and he was told he has fatty liver deseise.    HPI Jonathan Cunningham presents for   Annual exam. Last ate 4.5 hrs ago.   Cardiology - Dr.  Rosiland Oz. History of tetralogy of fallot - last surgery at age 29. No concerns - follow up in 1.50yrs.   Steatohepatitis, GERD.  GI: Dr. Loreta Ave.  Ultrasound 2/16- steatosis.  Gallbladder EF 50% on 2/16.  Famotidine as needed for heartburn.   Obesity: Body mass index is 47.85 kg/m. Wt Readings from Last 3 Encounters:  11/14/19 292 lb (132.5 kg)  07/19/19 281 lb (127.5 kg)  04/25/19 285 lb 3.2 oz (129.4 kg)   Trying to eat healthier - back in gym past month. Avoiding fried and fatty foods, soda.  Declines medical or surgical bariatric eval at this time.  Taking vit D, fish oil otc.    Depression screen [facility] 2/9 11/14/2019 07/19/2019 04/25/2019 04/25/2019 08/13/2016  Decreased Interest 0 0 0 0 0  Down, Depressed, Hopeless 0 0 0 0 0  PHQ - 2 Score 0 0 0 0 0   Immunization History  Administered Date(s) Administered  . Influenza,inj,Quad PF,6+ Mos 10/07/2013, 06/11/2014, 07/24/2018, 06/11/2019  . Influenza-Unspecified 07/07/2016, 07/24/2018  . Tdap 02/12/2013  covid vaccine yesterday.   Dental: every 6 months - appt tomorrow.   No exam data present optho eval yearly, on [employer] Wears glasses daily.   Exercise: currently at 3 days per week, 1.5 hrs. walking and weights. Goal of 4-5 days per week  No alcohol, no tobacco.   STI testing - declines - not currently sexually active.  Small left varicocele on testicular ultrasound 05/01/19.   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   History Patient Active Problem List   Diagnosis Date Noted  . Obesity  11/14/2019  . Diarrhea 07/19/2019  . Pulmonary valve stenosis 10/22/2017  . Tetralogy of Fallot 02/09/2015  . Tetralogy of Fallot s/p repair 02/14/2012   Past Medical History:  Diagnosis Date  . Diarrhea 07/19/2019  . GERD (gastroesophageal reflux disease)   . Heart murmur    Past Surgical History:  Procedure Laterality Date  . CARDIAC SURGERY     No Known Allergies Prior to Admission medications   Medication Sig Start Date End Date Taking? Authorizing Provider  acetaminophen (TYLENOL) 500 MG tablet Take 1 tablet (500 mg total) by mouth every 6 (six) hours as needed. 11/16/14   Ofilia Neas, PA-C  cetirizine (ZYRTEC) 10 MG tablet Take 10 mg by mouth daily as needed for allergies.     [provider]  dicyclomine (BENTYL) 20 MG tablet Take 1 tablet (20 mg total) by mouth 2 (two) times daily. 07/21/19   Cathie Hoops, Amy V, PA-C  Probiotic Product (PROBIOTIC-10 PO) Take by mouth.    [provider]   Social History   Socioeconomic History  . Marital status: Single    Spouse name: Not on file  . Number of children: Not on file  . Years of education: Not on file  . Highest education level: Not on file  Occupational History  . Not on file  Tobacco Use  . Smoking status:  Never Smoker  . Smokeless tobacco: Never Used  Substance and Sexual Activity  . Alcohol use: No    Alcohol/week: 0.0 standard drinks  . Drug use: No  . Sexual activity: Not on file  Other Topics Concern  . Not on file  Social History Narrative  . Not on file   Social Determinants of Health   Financial Resource Strain:   . Difficulty of Paying Living Expenses: Not on file  Food Insecurity:   . Worried About Charity fundraiser in the Last Year: Not on file  . Ran Out of Food in the Last Year: Not on file  Transportation Needs:   . Lack of Transportation (Medical): Not on file  . Lack of Transportation (Non-Medical): Not on file  Physical Activity:   . Days of Exercise per Week: Not on  file  . Minutes of Exercise per Session: Not on file  Stress:   . Feeling of Stress : Not on file  Social Connections:   . Frequency of Communication with Friends and Family: Not on file  . Frequency of Social Gatherings with Friends and Family: Not on file  . Attends Religious Services: Not on file  . Active Member of Clubs or Organizations: Not on file  . Attends Archivist Meetings: Not on file  . Marital Status: Not on file  Intimate Partner Violence:   . Fear of Current or Ex-Partner: Not on file  . Emotionally Abused: Not on file  . Physically Abused: Not on file  . Sexually Abused: Not on file    Review of Systems   Objective:   Vitals:   11/14/19 1357  BP: 117/80  Pulse: 78  Temp: 98 F (36.7 C)  TempSrc: Temporal  SpO2: 98%  Weight: 292 lb (132.5 kg)  Height: 5' 5.5" (1.664 m)     Physical Exam Vitals reviewed.  Constitutional:      General: He is not in acute distress.    Appearance: He is well-developed. He is obese. He is not ill-appearing, toxic-appearing or diaphoretic.  HENT:     Head: Normocephalic and atraumatic.     Right Ear: External ear normal.     Left Ear: External ear normal.  Eyes:     Conjunctiva/sclera: Conjunctivae normal.     Pupils: Pupils are equal, round, and reactive to light.  Neck:     Thyroid: No thyromegaly.  Cardiovascular:     Rate and Rhythm: Normal rate and regular rhythm.     Heart sounds: Murmur (Crescendo 3-4/6) present.  Pulmonary:     Effort: Pulmonary effort is normal. No respiratory distress.     Breath sounds: Normal breath sounds. No wheezing.  Abdominal:     General: There is no distension.     Palpations: Abdomen is soft.     Tenderness: There is no abdominal tenderness.     Hernia: A hernia is present.  Musculoskeletal:        General: No tenderness. Normal range of motion.     Cervical back: Normal range of motion and neck supple.  Lymphadenopathy:     Cervical: No cervical adenopathy.    Skin:    General: Skin is warm and dry.  Neurological:     Mental Status: He is alert and oriented to person, place, and time.     Deep Tendon Reflexes: Reflexes are normal and symmetric.  Psychiatric:        Behavior: Behavior normal.  Assessment & Plan:  Jonathan Cunningham is a 34 y.o. male . Annual physical exam  - -anticipatory guidance as below in AVS, screening labs above. Health maintenance items as above in HPI discussed/recommended as applicable.   Class 3 severe obesity without serious comorbidity with body mass index (BMI) of 45.0 to 49.9 in adult, unspecified obesity type (HCC) - Plan: Hemoglobin A1c, Comprehensive metabolic panel, Lipid panel  - commended on diet changes,, exercise. Option of medical or surgical bariatrics if difficulty with weight loss. recehck in 6 months.   Screening for diabetes mellitus - Plan: Hemoglobin A1c, Comprehensive metabolic panel  Screening for thyroid disorder - Plan: TSH  Screening for deficiency anemia - Plan: CBC  Screening for hyperlipidemia - Plan: Lipid panel  Steatosis - Plan: Comprehensive metabolic panel  - continued weight loss. GI follow up as planned. Repeat labs.   No orders of the defined types were placed in this encounter.  Patient Instructions    Keep up the good work with avoiding sugar-containing beverages, portion sizes, and increasing exercise.  I will check some baseline labs today, but follow-up in 6 months likely to repeat this test.  Let me know if there are questions and take care.   Keeping you healthy  Get these tests  Blood pressure- Have your blood pressure checked once a year by your healthcare provider.  Normal blood pressure is 120/80.  Weight- Have your body mass index (BMI) calculated to screen for obesity.  BMI is a measure of body fat based on height and weight. You can also calculate your own BMI at https://www.west-esparza.com/.  Cholesterol- Have your cholesterol checked regularly  starting at age 43, sooner may be necessary if you have diabetes, high blood pressure, if a family member developed heart diseases at an early age or if you smoke.   Chlamydia, HIV, and other sexual transmitted disease- Get screened each year until the age of 62 then within three months of each new sexual partner.  Diabetes- Have your blood sugar checked regularly if you have high blood pressure, high cholesterol, a family history of diabetes or if you are overweight.  Get these vaccines  Flu shot- Every fall.  Tetanus shot- Every 10 years.  Menactra- Single dose; prevents meningitis.  Take these steps  Don't smoke- If you do smoke, ask your healthcare provider about quitting. For tips on how to quit, go to www.smokefree.gov or call 1-800-QUIT-NOW.  Be physically active- Exercise 5 days a week for at least 30 minutes.  If you are not already physically active start slow and gradually work up to 30 minutes of moderate physical activity.  Examples of moderate activity include walking briskly, mowing the yard, dancing, swimming bicycling, etc.  Eat a healthy diet- Eat a variety of healthy foods such as fruits, vegetables, low fat milk, low fat cheese, yogurt, lean meats, poultry, fish, beans, tofu, etc.  For more information on healthy eating, go to www.thenutritionsource.org  Drink alcohol in moderation- Limit alcohol intake two drinks or less a day.  Never drink and drive.  Dentist- Brush and floss teeth twice daily; visit your dentis twice a year.  Depression-Your emotional health is as important as your physical health.  If you're feeling down, losing interest in things you normally enjoy please talk with your healthcare provider.  Gun Safety- If you keep a gun in your home, keep it unloaded and with the safety lock on.  Bullets should be stored separately.  Helmet use- Always wear a helmet  when riding a motorcycle, bicycle, rollerblading or skateboarding.  Safe sex- If you may be  exposed to a sexually transmitted infection, use a condom  Seat belts- Seat bels can save your life; always wear one.  Smoke/Carbon Monoxide detectors- These detectors need to be installed on the appropriate level of your home.  Replace batteries at least once a year.  Skin Cancer- When out in the sun, cover up and use sunscreen SPF 15 or higher.  Violence- If anyone is threatening or hurting you, please tell your healthcare provider.   If you have lab work done today you will be contacted with your lab results within the next 2 weeks.  If you have not heard from [place] then please contact us. The fastest way to get your results is to register for My Chart.   IF you received an x-ray today, you will receive an invoice from [facility]. Please contact [facility] at [phone] with questions or concerns regarding your invoice.   IF you received labwork today, you will receive an invoice from [facility]. Please contact [facility] at [phone] with questions or concerns regarding your invoice.   Our billing staff will not be able to assist you with questions regarding bills from these companies.  You will be contacted with the lab results as soon as they are available. The fastest way to get your results is to activate your My Chart account. Instructions are located on the last page of this paperwork. If you have not heard from [place] regarding the results in 2 weeks, please contact this office.         Signed, Meredith Staggers, MD [facility]

## 2019-11-14 NOTE — Patient Instructions (Addendum)
Keep up the good work with avoiding sugar-containing beverages, portion sizes, and increasing exercise.  I will check some baseline labs today, but follow-up in 6 months likely to repeat this test.  Let me know if there are questions and take care.   Keeping you healthy  Get these tests  Blood pressure- Have your blood pressure checked once a year by your healthcare provider.  Normal blood pressure is 120/80.  Weight- Have your body mass index (BMI) calculated to screen for obesity.  BMI is a measure of body fat based on height and weight. You can also calculate your own BMI at GravelBags.it.  Cholesterol- Have your cholesterol checked regularly starting at age 4, sooner may be necessary if you have diabetes, high blood pressure, if a family member developed heart diseases at an early age or if you smoke.   Chlamydia, HIV, and other sexual transmitted disease- Get screened each year until the age of 8 then within three months of each new sexual partner.  Diabetes- Have your blood sugar checked regularly if you have high blood pressure, high cholesterol, a family history of diabetes or if you are overweight.  Get these vaccines  Flu shot- Every fall.  Tetanus shot- Every 10 years.  Menactra- Single dose; prevents meningitis.  Take these steps  Don't smoke- If you do smoke, ask your healthcare provider about quitting. For tips on how to quit, go to www.smokefree.gov or call 1-800-QUIT-NOW.  Be physically active- Exercise 5 days a week for at least 30 minutes.  If you are not already physically active start slow and gradually work up to 30 minutes of moderate physical activity.  Examples of moderate activity include walking briskly, mowing the yard, dancing, swimming bicycling, etc.  Eat a healthy diet- Eat a variety of healthy foods such as fruits, vegetables, low fat milk, low fat cheese, yogurt, lean meats, poultry, fish, beans, tofu, etc.  For more information on  healthy eating, go to www.thenutritionsource.org  Drink alcohol in moderation- Limit alcohol intake two drinks or less a day.  Never drink and drive.  Dentist- Brush and floss teeth twice daily; visit your dentis twice a year.  Depression-Your emotional health is as important as your physical health.  If you're feeling down, losing interest in things you normally enjoy please talk with your healthcare provider.  Gun Safety- If you keep a gun in your home, keep it unloaded and with the safety lock on.  Bullets should be stored separately.  Helmet use- Always wear a helmet when riding a motorcycle, bicycle, rollerblading or skateboarding.  Safe sex- If you may be exposed to a sexually transmitted infection, use a condom  Seat belts- Seat bels can save your life; always wear one.  Smoke/Carbon Monoxide detectors- These detectors need to be installed on the appropriate level of your home.  Replace batteries at least once a year.  Skin Cancer- When out in the sun, cover up and use sunscreen SPF 15 or higher.  Violence- If anyone is threatening or hurting you, please tell your healthcare provider.   If you have lab work done today you will be contacted with your lab results within the next 2 weeks.  If you have not heard from [place] then please contact us. The fastest way to get your results is to register for My Chart.   IF you received an x-ray today, you will receive an invoice from [facility]. Please contact [facility]iology at [phone] with questions or concerns regarding your  invoice.   IF you received labwork today, you will receive an invoice from [facility]. Please contact [facility] at [phone] with questions or concerns regarding your invoice.   Our billing staff will not be able to assist you with questions regarding bills from these companies.  You will be contacted with the lab results as soon as they are available. The fastest way to get your results is to  activate your My Chart account. Instructions are located on the last page of this paperwork. If you have not heard from [place] regarding the results in 2 weeks, please contact this office.

## 2019-11-15 LAB — CBC
Hematocrit: 45 % (ref 37.5–51.0)
Hemoglobin: 15.2 g/dL (ref 13.0–17.7)
MCH: 29.6 pg (ref 26.6–33.0)
MCHC: 33.8 g/dL (ref 31.5–35.7)
MCV: 88 fL (ref 79–97)
Platelets: 295 10*3/uL (ref 150–450)
RBC: 5.14 x10E6/uL (ref 4.14–5.80)
RDW: 13 % (ref 11.6–15.4)
WBC: 8.8 10*3/uL (ref 3.4–10.8)

## 2019-11-15 LAB — COMPREHENSIVE METABOLIC PANEL
ALT: 18 IU/L (ref 0–44)
AST: 18 IU/L (ref 0–40)
Albumin/Globulin Ratio: 2.6 — ABNORMAL HIGH (ref 1.2–2.2)
Albumin: 4.6 g/dL (ref 4.0–5.0)
Alkaline Phosphatase: 60 IU/L (ref 39–117)
BUN/Creatinine Ratio: 15 (ref 9–20)
BUN: 10 mg/dL (ref 6–20)
Bilirubin Total: 0.6 mg/dL (ref 0.0–1.2)
CO2: 19 mmol/L — ABNORMAL LOW (ref 20–29)
Calcium: 9 mg/dL (ref 8.7–10.2)
Chloride: 108 mmol/L — ABNORMAL HIGH (ref 96–106)
Creatinine, Ser: 0.67 mg/dL — ABNORMAL LOW (ref 0.76–1.27)
GFR calc Af Amer: 148 mL/min/{1.73_m2} (ref >59)
GFR calc non Af Amer: 128 mL/min/{1.73_m2} (ref >59)
Globulin, Total: 1.8 g/dL (ref 1.5–4.5)
Glucose: 83 mg/dL (ref 65–99)
Potassium: 4.4 mmol/L (ref 3.5–5.2)
Sodium: 141 mmol/L (ref 134–144)
Total Protein: 6.4 g/dL (ref 6.0–8.5)

## 2019-11-15 LAB — HEMOGLOBIN A1C
Est. average glucose Bld gHb Est-mCnc: 103 mg/dL
Hgb A1c MFr Bld: 5.2 % (ref 4.8–5.6)

## 2019-11-15 LAB — LIPID PANEL
Chol/HDL Ratio: 5.1 ratio — ABNORMAL HIGH (ref 0.0–5.0)
Cholesterol, Total: 188 mg/dL (ref 100–199)
HDL: 37 mg/dL — ABNORMAL LOW (ref >39)
LDL Chol Calc (NIH): 115 mg/dL — ABNORMAL HIGH (ref 0–99)
Triglycerides: 203 mg/dL — ABNORMAL HIGH (ref 0–149)
VLDL Cholesterol Cal: 36 mg/dL (ref 5–40)

## 2019-11-15 LAB — TSH: TSH: 1.81 u[IU]/mL (ref 0.450–4.500)

## 2019-11-23 ENCOUNTER — Telehealth: Payer: Self-pay | Admitting: Family Medicine

## 2019-11-23 NOTE — Telephone Encounter (Signed)
Pt  Would like a call concerning his most recent lab visit. Please advise at [phone].

## 2019-11-23 NOTE — Telephone Encounter (Signed)
Attempted to call pt no answer. Left message to call back and informed him that the provider did send him a message via my chart as well.   If pt calls back. Okay to relay message below    LAB RESULTS:  "Diabetes screening test was normal.  Electrolytes overall looked okay.  Borderline CO2/chloride not concerning as well as Albumin/globulin ratio.  Blood counts look normal.  Cholesterol mildly elevated.  Exercise/diet changes should help these numbers.  Recheck in 6 months.  Let me know if there are questions."   Dr. Neva Seat

## 2020-03-30 DIAGNOSIS — Z20822 Contact with and (suspected) exposure to covid-19: Secondary | ICD-10-CM | POA: Diagnosis not present

## 2020-03-30 DIAGNOSIS — B349 Viral infection, unspecified: Secondary | ICD-10-CM | POA: Diagnosis not present

## 2020-05-14 ENCOUNTER — Ambulatory Visit: Payer: BC Managed Care – PPO | Admitting: Family Medicine

## 2020-05-28 DIAGNOSIS — D485 Neoplasm of uncertain behavior of skin: Secondary | ICD-10-CM | POA: Diagnosis not present

## 2020-05-28 DIAGNOSIS — D225 Melanocytic nevi of trunk: Secondary | ICD-10-CM | POA: Diagnosis not present

## 2020-05-28 DIAGNOSIS — Z1283 Encounter for screening for malignant neoplasm of skin: Secondary | ICD-10-CM | POA: Diagnosis not present

## 2020-06-11 DIAGNOSIS — D225 Melanocytic nevi of trunk: Secondary | ICD-10-CM | POA: Diagnosis not present

## 2020-06-11 DIAGNOSIS — L98429 Non-pressure chronic ulcer of back with unspecified severity: Secondary | ICD-10-CM | POA: Diagnosis not present

## 2020-06-11 DIAGNOSIS — D485 Neoplasm of uncertain behavior of skin: Secondary | ICD-10-CM | POA: Diagnosis not present

## 2020-06-24 DIAGNOSIS — D485 Neoplasm of uncertain behavior of skin: Secondary | ICD-10-CM | POA: Diagnosis not present

## 2020-06-24 DIAGNOSIS — L988 Other specified disorders of the skin and subcutaneous tissue: Secondary | ICD-10-CM | POA: Diagnosis not present

## 2020-06-25 ENCOUNTER — Ambulatory Visit (INDEPENDENT_AMBULATORY_CARE_PROVIDER_SITE_OTHER): Payer: BC Managed Care – PPO

## 2020-06-25 ENCOUNTER — Encounter: Payer: Self-pay | Admitting: Family Medicine

## 2020-06-25 ENCOUNTER — Other Ambulatory Visit: Payer: Self-pay

## 2020-06-25 ENCOUNTER — Ambulatory Visit: Payer: BC Managed Care – PPO | Admitting: Family Medicine

## 2020-06-25 VITALS — BP 119/82 | HR 77 | Temp 98.0°F | Ht 65.5 in | Wt 296.0 lb

## 2020-06-25 DIAGNOSIS — Z6841 Body Mass Index (BMI) 40.0 and over, adult: Secondary | ICD-10-CM | POA: Diagnosis not present

## 2020-06-25 DIAGNOSIS — M545 Low back pain, unspecified: Secondary | ICD-10-CM | POA: Diagnosis not present

## 2020-06-25 DIAGNOSIS — M25551 Pain in right hip: Secondary | ICD-10-CM | POA: Diagnosis not present

## 2020-06-25 DIAGNOSIS — Z23 Encounter for immunization: Secondary | ICD-10-CM

## 2020-06-25 NOTE — Patient Instructions (Addendum)
Low intensity exercise most days per week - 150 minutes minimum per week. . Continue to avoid sugar containing beverages, fast food. Do not skip breakfast. I will refer you to nutritionist.   Let me know if you would like to meet with medical or surgical weight loss specialist.   Hip pain is likely radiating pain from your low back.  See information below on back pain.  Try range of motion, stretches before going to bed as well as first thing in the morning.  Heat or ice to the area if it is sore and Tylenol is fine as needed.  Follow-up if not improving the next 4 to 6 weeks.  Sooner if worse.   We can recheck cholesterol in the next 3 to 6 months.  Please let me know if you have questions and thank you for coming in today.   Chronic Back Pain When back pain lasts longer than 3 months, it is called chronic back pain.The cause of your back pain may not be known. Some common causes include:  Wear and tear (degenerative disease) of the bones, ligaments, or disks in your back.  Inflammation and stiffness in your back (arthritis). People who have chronic back pain often go through certain periods in which the pain is more intense (flare-ups). Many people can learn to manage the pain with home care. Follow these instructions at home: Pay attention to any changes in your symptoms. Take these actions to help with your pain: Activity   Avoid bending and other activities that make the problem worse.  Maintain a proper position when standing or sitting: ? When standing, keep your upper back and neck straight, with your shoulders pulled back. Avoid slouching. ? When sitting, keep your back straight and relax your shoulders. Do not round your shoulders or pull them backward.  Do not sit or stand in one place for long periods of time.  Take brief periods of rest throughout the day. This will reduce your pain. Resting in a lying or standing position is usually better than sitting to rest.  When  you are resting for longer periods, mix in some mild activity or stretching between periods of rest. This will help to prevent stiffness and pain.  Get regular exercise. Ask your health care provider what activities are safe for you.  Do not lift anything that is heavier than 10 lb (4.5 kg). Always use proper lifting technique, which includes: ? Bending your knees. ? Keeping the load close to your body. ? Avoiding twisting.  Sleep on a firm mattress in a comfortable position. Try lying on your side with your knees slightly bent. If you lie on your back, put a pillow under your knees. Managing pain  If directed, apply ice to the painful area. Your health care provider may recommend applying ice during the first 24-48 hours after a flare-up begins. ? Put ice in a plastic bag. ? Place a towel between your skin and the bag. ? Leave the ice on for 20 minutes, 2-3 times per day.  If directed, apply heat to the affected area as often as told by your health care provider. Use the heat source that your health care provider recommends, such as a moist heat pack or a heating pad. ? Place a towel between your skin and the heat source. ? Leave the heat on for 20-30 minutes. ? Remove the heat if your skin turns bright red. This is especially important if you are unable to feel pain,  heat, or cold. You may have a greater risk of getting burned.  Try soaking in a warm tub.  Take over-the-counter and prescription medicines only as told by your health care provider.  Keep all follow-up visits as told by your health care provider. This is important. Contact a health care provider if:  You have pain that is not relieved with rest or medicine. Get help right away if:  You have weakness or numbness in one or both of your legs or feet.  You have trouble controlling your bladder or your bowels.  You have nausea or vomiting.  You have pain in your abdomen.  You have shortness of breath or you  faint. This information is not intended to replace advice given to you by your health care provider. Make sure you discuss any questions you have with your health care provider. Document Revised: 12/14/2018 Document Reviewed: 03/02/2017 Elsevier Patient Education  [employer].    If you have lab work done today you will be contacted with your lab results within the next 2 weeks.  If you have not heard from [place] then please contact us. The fastest way to get your results is to register for My Chart.   IF you received an x-ray today, you will receive an invoice from [facility]. Please contact [facility] at [phone] with questions or concerns regarding your invoice.   IF you received labwork today, you will receive an invoice from [place]. Please contact [facility] at [phone] with questions or concerns regarding your invoice.   Our billing staff will not be able to assist you with questions regarding bills from these companies.  You will be contacted with the lab results as soon as they are available. The fastest way to get your results is to activate your My Chart account. Instructions are located on the last page of this paperwork. If you have not heard from [place] regarding the results in 2 weeks, please contact this office.

## 2020-06-25 NOTE — Progress Notes (Signed)
Subjective:  Patient ID: Jonathan Cunningham, male    DOB: 23-Mar-1986  Age: 34 y.o. MRN: [id number]  CC:  Chief Complaint  Patient presents with  . Obesity    Pt reports he would like to get his weight under control.Pt reports he has been in a yo-yo weight loss and weight gain cycel.     HPI Jonathan Cunningham presents for   Obesity: Discussed at his physical in March.  Option of medical or surgical weight loss specialist referral discussed.  Planned on some diet changes and exercise at that time.  Weight up 4 pounds from March.  Has made some changes - cut back on portions, 2 meals per day. Protein shake for breakfast. Down to 288, then gained some back. Fast food: works at [employer], [employer] at times.  Diet soda, green tea. No sweet tea.    Not interested in surgery. Does not want to meet with medical weight loss specialist. Agreeable to nutritionist.  All members of family are overweight.   Exercise - with work only. 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 Has room to increase exercise.   Lab Results  Component Value Date   TSH 1.810 11/14/2019    Body mass index is 48.51 kg/m. Wt Readings from Last 3 Encounters:  06/25/20 296 lb (134.3 kg)  11/14/19 292 lb (132.5 kg)  07/19/19 281 lb (127.5 kg)    Hyperlipidemia: Declines repeat testing today - plans on changes above.  Lab Results  Component Value Date   CHOL 188 11/14/2019   HDL 37 (L) 11/14/2019   LDLCALC 115 (H) 11/14/2019   TRIG 203 (H) 11/14/2019   CHOLHDL 5.1 (H) 11/14/2019   Lab Results  Component Value Date   ALT 18 11/14/2019   AST 18 11/14/2019   ALKPHOS 60 11/14/2019   BILITOT 0.6 11/14/2019   R hip pain: Notices if on feet for long time for years.  Now sore in am, feels better with walking around strength returns. Past few weeks.  Back of R buttocks.  Stiff in low back after work at times.  No radicular pain.  No bowel or bladder incontinence, no saddle anesthesia, no  lower extremity weakness.  Had fall at work a year ago, evaluated at urgent care - no XR. No recent fall injury. Slight back pain at times prior to fall, but worse after fall. Primarily soreness in R buttock now.  Tx: none   History Patient Active Problem List   Diagnosis Date Noted  . Obesity 11/14/2019  . Diarrhea 07/19/2019  . Pulmonary valve stenosis 10/22/2017  . Tetralogy of Fallot 02/09/2015  . Tetralogy of Fallot s/p repair 02/14/2012   Past Medical History:  Diagnosis Date  . Diarrhea 07/19/2019  . GERD (gastroesophageal reflux disease)   . Heart murmur    Past Surgical History:  Procedure Laterality Date  . CARDIAC SURGERY     No Known Allergies Prior to Admission medications   Medication Sig Start Date End Date Taking? Authorizing Provider  acetaminophen (TYLENOL) 500 MG tablet Take 1 tablet (500 mg total) by mouth every 6 (six) hours as needed. 11/16/14  Yes Ofilia Neas, PA-C  cetirizine (ZYRTEC) 10 MG tablet Take 10 mg by mouth daily as needed for allergies.    Yes [provider]  famotidine (PEPCID) 40 MG tablet Take 40 mg by mouth 2 (two) times daily. 10/11/19  Yes [provider]  Omega-3 Fatty Acids (FISH OIL) 1200  MG CAPS Take by mouth.   Yes [provider]  Probiotic Product (PROBIOTIC-10 PO) Take by mouth.   Yes [provider]  Cholecalciferol (D3 DOTS) 50 MCG (2000 UT) TBDP Take by mouth. Patient not taking: Reported on 06/25/2020    [provider]  dicyclomine (BENTYL) 20 MG tablet Take 1 tablet (20 mg total) by mouth 2 (two) times daily. Patient not taking: Reported on 06/25/2020 07/21/19   Lurline IdolYu, Amy V, PA-C   Social History   Socioeconomic History  . Marital status: Single    Spouse name: Not on file  . Number of children: Not on file  . Years of education: Not on file  . Highest education level: Not on file  Occupational History  . Not on file  Tobacco Use  . Smoking status: Never Smoker  .  Smokeless tobacco: Never Used  Substance and Sexual Activity  . Alcohol use: No    Alcohol/week: 0.0 standard drinks  . Drug use: No  . Sexual activity: Not on file  Other Topics Concern  . Not on file  Social History Narrative  . Not on file   Social Determinants of Health   Financial Resource Strain:   . Difficulty of Paying Living Expenses: Not on file  Food Insecurity:   . Worried About Programme researcher, broadcasting/film/videounning Out of Food in the Last Year: Not on file  . Ran Out of Food in the Last Year: Not on file  Transportation Needs:   . Lack of Transportation (Medical): Not on file  . Lack of Transportation (Non-Medical): Not on file  Physical Activity:   . Days of Exercise per Week: Not on file  . Minutes of Exercise per Session: Not on file  Stress:   . Feeling of Stress : Not on file  Social Connections:   . Frequency of Communication with Friends and Family: Not on file  . Frequency of Social Gatherings with Friends and Family: Not on file  . Attends Religious Services: Not on file  . Active Member of Clubs or Organizations: Not on file  . Attends BankerClub or Organization Meetings: Not on file  . Marital Status: Not on file  Intimate Partner Violence:   . Fear of Current or Ex-Partner: Not on file  . Emotionally Abused: Not on file  . Physically Abused: Not on file  . Sexually Abused: Not on file    Review of Systems Per HPI.   Objective:   Vitals:   06/25/20 1059  BP: 119/82  Pulse: 77  Temp: 98 F (36.7 C)  TempSrc: Temporal  SpO2: 98%  Weight: 296 lb (134.3 kg)  Height: 5' 5.5" (1.664 m)     Physical Exam Constitutional:      General: He is not in acute distress.    Appearance: He is well-developed. He is obese.  HENT:     Head: Normocephalic and atraumatic.  Cardiovascular:     Rate and Rhythm: Normal rate.  Pulmonary:     Effort: Pulmonary effort is normal.  Musculoskeletal:     Comments: Lumbar spine, no bony tenderness, area of previous discomfort of the right  paraspinal muscles but nontender.  SI nontender.  Describes some discomfort at the right buttocks towards the sciatic notch but no focal tenderness.  Negative seated straight leg raise.  Right hip pain-free internal/external rotation.  No focal bony tenderness, no bursal tenderness.   Neurological:     Mental Status: He is alert and oriented to person, place, and time.  DG Lumbar Spine Complete  Result Date: 06/25/2020 CLINICAL DATA:  Right low back pain EXAM: LUMBAR SPINE - COMPLETE 4+ VIEW COMPARISON:  None. FINDINGS: Lumbar vertebral body heights and alignment are maintained. No evidence pars break treated. No significant disc space narrowing. Facet arthropathy at L5-S1. IMPRESSION: Facet arthropathy at L5-S1. Electronically Signed   By: Guadlupe Spanish M.D.   On: 06/25/2020 12:19   DG HIP UNILAT W OR W/O PELVIS 2-3 VIEWS RIGHT  Result Date: 06/25/2020 CLINICAL DATA:  Low back pain without known injury. EXAM: DG HIP (WITH OR WITHOUT PELVIS) 2-3V RIGHT COMPARISON:  None. FINDINGS: There is no evidence of hip fracture or dislocation. There is no evidence of arthropathy or other focal bone abnormality. IMPRESSION: Negative. Electronically Signed   By: Lupita Raider M.D.   On: 06/25/2020 12:24     Assessment & Plan:  Jonathan Cunningham is a 34 y.o. male . Class 3 severe obesity without serious comorbidity with body mass index (BMI) of 45.0 to 49.9 in adult, unspecified obesity type (HCC) - Plan: Amb ref to Medical Nutrition Therapy-MNT  -Commended on his approach was at previous diet changes.  At his request I will refer to nutrition to eval current diet and other possible changes.  Increase physical activity discussed.  Option of medical or bariatric surgeon eval but deferred at this time.  Plans on increasing exercise.  Will hold on repeat lab work including lipids at this time.  Need for vaccination - Plan: Flu Vaccine QUAD 36+ mos IM  Right hip pain - Plan: DG HIP UNILAT W OR W/O PELVIS  2-3 VIEWS RIGHT Right-sided low back pain without sciatica, unspecified chronicity - Plan: DG Lumbar Spine Complete  -Hip exam reassuring, reassuring x-ray, likely referred pain from lumbar spine.  Facet arthropathy noted but overall reassuring imaging.  Symptomatic care discussed with Tylenol, heat or ice, range of motion, handout given.  Recheck next 4 to 6 weeks if not improving, sooner if worse.   No orders of the defined types were placed in this encounter.  Patient Instructions    Low intensity exercise most days per week - 150 minutes minimum per week. . Continue to avoid sugar containing beverages, fast food. Do not skip breakfast. I will refer you to nutritionist.   Let me know if you would like to meet with medical or surgical weight loss specialist.   Hip pain is likely radiating pain from your low back.  See information below on back pain.  Try range of motion, stretches before going to bed as well as first thing in the morning.  Heat or ice to the area if it is sore and Tylenol is fine as needed.  Follow-up if not improving the next 4 to 6 weeks.  Sooner if worse.   We can recheck cholesterol in the next 3 to 6 months.  Please let me know if you have questions and thank you for coming in today.   Chronic Back Pain When back pain lasts longer than 3 months, it is called chronic back pain.The cause of your back pain may not be known. Some common causes include:  Wear and tear (degenerative disease) of the bones, ligaments, or disks in your back.  Inflammation and stiffness in your back (arthritis). People who have chronic back pain often go through certain periods in which the pain is more intense (flare-ups). Many people can learn to manage the pain with home care. Follow these instructions at home: Pay attention  to any changes in your symptoms. Take these actions to help with your pain: Activity   Avoid bending and other activities that make the problem worse.  Maintain  a proper position when standing or sitting: ? When standing, keep your upper back and neck straight, with your shoulders pulled back. Avoid slouching. ? When sitting, keep your back straight and relax your shoulders. Do not round your shoulders or pull them backward.  Do not sit or stand in one place for long periods of time.  Take brief periods of rest throughout the day. This will reduce your pain. Resting in a lying or standing position is usually better than sitting to rest.  When you are resting for longer periods, mix in some mild activity or stretching between periods of rest. This will help to prevent stiffness and pain.  Get regular exercise. Ask your health care provider what activities are safe for you.  Do not lift anything that is heavier than 10 lb (4.5 kg). Always use proper lifting technique, which includes: ? Bending your knees. ? Keeping the load close to your body. ? Avoiding twisting.  Sleep on a firm mattress in a comfortable position. Try lying on your side with your knees slightly bent. If you lie on your back, put a pillow under your knees. Managing pain  If directed, apply ice to the painful area. Your health care provider may recommend applying ice during the first 24-48 hours after a flare-up begins. ? Put ice in a plastic bag. ? Place a towel between your skin and the bag. ? Leave the ice on for 20 minutes, 2-3 times per day.  If directed, apply heat to the affected area as often as told by your health care provider. Use the heat source that your health care provider recommends, such as a moist heat pack or a heating pad. ? Place a towel between your skin and the heat source. ? Leave the heat on for 20-30 minutes. ? Remove the heat if your skin turns bright red. This is especially important if you are unable to feel pain, heat, or cold. You may have a greater risk of getting burned.  Try soaking in a warm tub.  Take over-the-counter and prescription  medicines only as told by your health care provider.  Keep all follow-up visits as told by your health care provider. This is important. Contact a health care provider if:  You have pain that is not relieved with rest or medicine. Get help right away if:  You have weakness or numbness in one or both of your legs or feet.  You have trouble controlling your bladder or your bowels.  You have nausea or vomiting.  You have pain in your abdomen.  You have shortness of breath or you faint. This information is not intended to replace advice given to you by your health care provider. Make sure you discuss any questions you have with your health care provider. Document Revised: 12/14/2018 Document Reviewed: 03/02/2017 Elsevier Patient Education  [employer].    If you have lab work done today you will be contacted with your lab results within the next 2 weeks.  If you have not heard from [place] then please contact us. The fastest way to get your results is to register for My Chart.   IF you received an x-ray today, you will receive an invoice from [facility]. Please contact [facility] at [phone] with questions or concerns regarding your invoice.  IF you received labwork today, you will receive an invoice from [facility]. Please contact [facility] at [phone] with questions or concerns regarding your invoice.   Our billing staff will not be able to assist you with questions regarding bills from these companies.  You will be contacted with the lab results as soon as they are available. The fastest way to get your results is to activate your My Chart account. Instructions are located on the last page of this paperwork. If you have not heard from [place] regarding the results in 2 weeks, please contact this office.         Signed, Merri Ray, MD [facility]

## 2020-07-23 ENCOUNTER — Ambulatory Visit: Payer: BC Managed Care – PPO | Admitting: Dietician

## 2020-09-12 ENCOUNTER — Other Ambulatory Visit: Payer: Self-pay

## 2020-09-12 ENCOUNTER — Encounter (HOSPITAL_COMMUNITY): Payer: Self-pay

## 2020-09-12 DIAGNOSIS — U071 COVID-19: Secondary | ICD-10-CM | POA: Insufficient documentation

## 2020-09-12 NOTE — ED Triage Notes (Signed)
Pt reports a positive home rapid test and directed here for further evaluation.

## 2020-09-13 ENCOUNTER — Emergency Department (HOSPITAL_COMMUNITY)
Admission: EM | Admit: 2020-09-13 | Discharge: 2020-09-13 | Disposition: A | Discharge status: Home / Self Care | Payer: BC Managed Care – PPO | Attending: Emergency Medicine | Admitting: Emergency Medicine

## 2020-09-13 DIAGNOSIS — U071 COVID-19: Secondary | ICD-10-CM

## 2020-09-13 NOTE — ED Notes (Signed)
Pt discharged from this ED in stable condition at this time. All discharge instructions and follow up care reviewed with pt with no further questions at this time. Pt ambulatory with steady gait, clear speech.  

## 2020-09-13 NOTE — Discharge Instructions (Signed)
You tested positive at home for COVID-19.    Please maintain isolation precautions.  Check your temperature regularly and take Tylenol as needed for fever control.  Increase your oral hydration and continue to eat regular meals.  I recommend over-the-counter medications as needed for symptom relief.  Get plenty of rest.    Unfortunately per current guidelines you do not qualify for MAB infusion treatments.  Please continue to check your temperature regularly.  I also recommend obtaining pulse oximeter to track your oxygenation if you develop worsening shortness of breath symptoms.  Follow-up with your primary care provider regarding today's encounter and for ongoing management.  I would also like for you to notify your cardiologist at [facility].   Return to the ED or seek immediate medical attention should you experience any new or worsening symptoms.

## 2020-09-13 NOTE — ED Provider Notes (Signed)
[facility]-EMERGENCY DEPT Provider Note   CSN: [id number] Arrival date & time: 09/12/20  2250     History Chief Complaint  Patient presents with   Nasal Congestion    Jonathan Cunningham is a 35 y.o. male with PMH of class III severe obesity and Tetralogy of Fallot who presents the ED after having a positive home COVID-19 test.  I reviewed patient's medical record and upon his most recent pediatric cardiology visit, his recent echocardiogram had revealed excellent stable long-term catheter and surgical intervention results for his congenital heart disease.  On my examination, patient reports that 2 days ago he developed congestion symptoms.  He states that they felt comparable to his seasonal allergies.  However, yesterday he took an at-home COVID-19 antigen test which was positive.  He lives at home with his parents you have been asymptomatic.  Patient is fully immunized and boosted.  However, given his structural cardiac disease, the on-call nurse encouraged him to consider ER evaluation.  He does not smoke tobacco.  He has been taking over-the-counter medications for symptomatic relief.  He had also been prescribed Tessalon Perles for cough symptoms should they worsen.  He only presented here to the ED for advice of on-call nurse as his family practitioner was not available for telephone consult.   He denies any fevers or chills, significant cough, shortness of breath or chest pain, abdominal pain, nausea or vomiting, unilateral extremity swelling or edema, history of clots or clotting disorder, or other symptoms.  HPI     Past Medical History:  Diagnosis Date   Diarrhea 07/19/2019   GERD (gastroesophageal reflux disease)    Heart murmur     Patient Active Problem List   Diagnosis Date Noted   Obesity 11/14/2019   Diarrhea 07/19/2019   Pulmonary valve stenosis 10/22/2017   Tetralogy of Fallot 02/09/2015   Tetralogy of Fallot s/p repair 02/14/2012     Past Surgical History:  Procedure Laterality Date   CARDIAC SURGERY         Family History  Problem Relation Age of Onset   Diabetes Father     Social History   Tobacco Use   Smoking status: Never Smoker   Smokeless tobacco: Never Used  Substance Use Topics   Alcohol use: No    Alcohol/week: 0.0 standard drinks   Drug use: No    Home Medications Prior to Admission medications   Medication Sig Start Date End Date Taking? Authorizing Provider  acetaminophen (TYLENOL) 500 MG tablet Take 1 tablet (500 mg total) by mouth every 6 (six) hours as needed. 11/16/14   Ofilia Neas, PA-C  cetirizine (ZYRTEC) 10 MG tablet Take 10 mg by mouth daily as needed for allergies.     [provider]  famotidine (PEPCID) 40 MG tablet Take 40 mg by mouth 2 (two) times daily. 10/11/19   [provider]  Omega-3 Fatty Acids (FISH OIL) 1200 MG CAPS Take by mouth.    [provider]  Probiotic Product (PROBIOTIC-10 PO) Take by mouth.    [provider]    Allergies    Patient has no known allergies.  Review of Systems   Review of Systems  All other systems reviewed and are negative.  Physical Exam Updated Vital Signs BP (!) 126/94    Pulse 81    Temp 98.6 F (37 C) (Oral)    Resp 18    SpO2 100%   Physical Exam Vitals and nursing note reviewed. Exam  conducted with a chaperone present.  Constitutional:      General: He is not in acute distress.    Appearance: Normal appearance. He is not ill-appearing.  HENT:     Head: Normocephalic and atraumatic.  Eyes:     General: No scleral icterus.    Conjunctiva/sclera: Conjunctivae normal.  Cardiovascular:     Rate and Rhythm: Normal rate and regular rhythm.     Pulses: Normal pulses.     Heart sounds: Normal heart sounds.  Pulmonary:     Effort: Pulmonary effort is normal. No respiratory distress.     Breath sounds: Normal breath sounds. No wheezing or rales.     Comments: No increased work  of breathing.  CTA bilaterally.  Even, unlabored.  No tachypnea.  100% oxygenation on RA. Musculoskeletal:        General: Normal range of motion.     Right lower leg: No edema.     Left lower leg: No edema.  Skin:    General: Skin is dry.  Neurological:     Mental Status: He is alert and oriented to person, place, and time.     GCS: GCS eye subscore is 4. GCS verbal subscore is 5. GCS motor subscore is 6.  Psychiatric:        Mood and Affect: Mood normal.        Behavior: Behavior normal.        Thought Content: Thought content normal.     ED Results / Procedures / Treatments   Labs (all labs ordered are listed, but only abnormal results are displayed) Labs Reviewed - No data to display  EKG None  Radiology No results found.  Procedures Procedures (including critical care time)  Medications Ordered in ED Medications - No data to display  ED Course  I have reviewed the triage vital signs and the nursing notes.  Pertinent labs & imaging results that were available during my care of the patient were reviewed by me and considered in my medical decision making (see chart for details).    MDM Rules/Calculators/A&P                          Patient reports positive home COVID-19 testing.  Do not feel as though repeat confirmatory COVID-19 testing is warranted.  Patient is without any significant cough, chest pain, shortness of breath symptoms.  Do not feel as though laboratory work-up with her imaging is warranted at this time.  Encouraged continued over-the-counter therapies for symptomatic relief.  Also encouraged him to check his temperature regularly and take Tylenol or ibuprofen as needed for fever control.  Isolation precautions discussed.  Encouraged him to follow-up with his primary care provider for ongoing evaluation and management.  Emphasized the importance of rest, increase oral hydration, and regular meals.  ED return precautions discussed.  Patient voices  understanding and is agreeable to the plan.  Jonathan Cunningham was evaluated in Emergency Department on 09/13/2020 for the symptoms described in the history of present illness. He was evaluated in the context of the global COVID-19 pandemic, which necessitated consideration that the patient might be at risk for infection with the SARS-CoV-2 virus that causes COVID-19. Institutional protocols and algorithms that pertain to the evaluation of patients at risk for COVID-19 are in a state of rapid change based on information released by regulatory bodies including the CDC and federal and state organizations. These policies and algorithms were followed during the  patient's care in the ED.   Final Clinical Impression(s) / ED Diagnoses Final diagnoses:  COVID-19    Rx / DC Orders ED Discharge Orders    None       Lorelee New, PA-C 09/13/20 1201    Lorre Nick, MD 09/14/20 [phone]

## 2020-09-15 ENCOUNTER — Telehealth: Payer: Self-pay | Admitting: Family Medicine

## 2020-09-15 NOTE — Telephone Encounter (Signed)
Followed up with patient from after hours fax that came in over weekend/ patient declined appointment / patient was seen at urgent care

## 2020-09-17 ENCOUNTER — Other Ambulatory Visit: Payer: Self-pay

## 2020-09-17 ENCOUNTER — Encounter: Payer: Self-pay | Admitting: Family Medicine

## 2020-09-17 ENCOUNTER — Telehealth (INDEPENDENT_AMBULATORY_CARE_PROVIDER_SITE_OTHER): Payer: BC Managed Care – PPO | Admitting: Family Medicine

## 2020-09-17 DIAGNOSIS — R0981 Nasal congestion: Secondary | ICD-10-CM

## 2020-09-17 MED ORDER — MUCINEX MAXIMUM STRENGTH 1200 MG PO TB12: 1.0000 | ORAL_TABLET | Freq: Two times a day (BID) | ORAL | 1 refills | Status: DC | PRN

## 2020-09-17 MED ORDER — FLUTICASONE PROPIONATE 50 MCG/ACT NA SUSP: 2.0000 | Freq: Every day | NASAL | 6 refills | Status: DC

## 2020-09-17 NOTE — Progress Notes (Addendum)
Virtual Visit Note  I connected with patient on 09/17/20 at 1200 by telephone due to unable to work Epic video visit and verified that I am speaking with the correct person using two identifiers. FREDERIK STANDLEY is currently located at home and no family members are currently with them during visit. The provider, Azalee Course Nakiya Rallis, FNP is located in their office at time of visit.  I discussed the limitations, risks, security and privacy concerns of performing an evaluation and management service by telephone and the availability of in person appointments. I also discussed with the patient that there may be a patient responsible charge related to this service. The patient expressed understanding and agreed to proceed.   I provided 20 minutes of non-face-to-face time during this encounter.  Chief Complaint  Patient presents with  . Nasal Congestion    Runny nose , not sleeping at , burning sensation R side, nose bleed 3 times yesterday   . chest congestion    No cough - describes as drainage - mucinex and sudafed     HPI ? Symptoms started Friday Took a home covid test and was positive Symptoms continue to improve but are lingering Nose still running Uses home humidifier, hot shower Last day of quarantine Out for work today Has been taking tessalon pearls Bromate nasal spray Unsure where he contacted this Was in the ED for 12 hours   No Known Allergies  Prior to Admission medications   Medication Sig Start Date End Date Taking? Authorizing Provider  acetaminophen (TYLENOL) 500 MG tablet Take 1 tablet (500 mg total) by mouth every 6 (six) hours as needed. 11/16/14  Yes Ofilia Neas, PA-C  cetirizine (ZYRTEC) 10 MG tablet Take 10 mg by mouth daily as needed for allergies.    Yes [provider]  famotidine (PEPCID) 40 MG tablet Take 40 mg by mouth 2 (two) times daily. 10/11/19  Yes [provider]  Omega-3 Fatty Acids (FISH OIL) 1200 MG CAPS Take by mouth.   Yes  [provider]  Probiotic Product (PROBIOTIC-10 PO) Take by mouth. Patient not taking: Reported on 09/17/2020    [provider]    Past Medical History:  Diagnosis Date  . Diarrhea 07/19/2019  . GERD (gastroesophageal reflux disease)   . Heart murmur     Past Surgical History:  Procedure Laterality Date  . CARDIAC SURGERY      Social History   Tobacco Use  . Smoking status: Never Smoker  . Smokeless tobacco: Never Used  Substance Use Topics  . Alcohol use: No    Alcohol/week: 0.0 standard drinks    Family History  Problem Relation Age of Onset  . Diabetes Father     Review of Systems  Constitutional: Negative for chills, fever and malaise/fatigue.  HENT: Positive for congestion. Negative for sinus pain and sore throat.   Respiratory: Negative for cough, sputum production, shortness of breath and wheezing.   Cardiovascular: Negative for chest pain and palpitations.  Gastrointestinal: Negative for abdominal pain, blood in stool, constipation, nausea and vomiting.  Neurological: Negative for headaches.    Objective  Constitutional:      General: Not in acute distress.    Appearance: Normal appearance. Not ill-appearing.   Pulmonary:     Effort: Pulmonary effort is normal. No respiratory distress.  Neurological:     Mental Status: Alert and oriented to person, place, and time.  Psychiatric:        Mood and Affect: Mood  normal.        Behavior: Behavior normal.     ASSESSMENT and PLAN  Problem List Items Addressed This Visit   None   Visit Diagnoses    Congestion of nasal sinus    -  Primary   Relevant Medications   Guaifenesin (MUCINEX MAXIMUM STRENGTH) 1200 MG TB12   fluticasone (FLONASE) 50 MCG/ACT nasal spray     RTC/ED precautions provided R/s/b of medications discussed Answered all questions  Return if symptoms worsen or fail to improve.   The above assessment and management plan was discussed with the patient. The patient  verbalized understanding of and has agreed to the management plan. Patient is aware to call the clinic if symptoms persist or worsen. Patient is aware when to return to the clinic for a follow-up visit. Patient educated on when it is appropriate to go to the emergency department.     Macario Carls Breckyn Troyer, FNP-BC Primary Care at [facility] [address] [place], [place] [address] Ph.  [phone] Fax [phone]  I have reviewed and agree with above documentation. Edwina Barth, MD

## 2020-09-17 NOTE — Patient Instructions (Addendum)
 COVID-19: What to Do if You Are Sick If you have a fever, cough or other symptoms, you might have COVID-19. Most people have mild illness and are able to recover at home. If you are sick:  Keep track of your symptoms.  If you have an emergency warning sign (including trouble breathing), call 911. Steps to help prevent the spread of COVID-19 if you are sick If you are sick with COVID-19 or think you might have COVID-19, follow the steps below to care for yourself and to help protect other people in your home and community. Stay home except to get medical care  Stay home. Most people with COVID-19 have mild illness and can recover at home without medical care. Do not leave your home, except to get medical care. Do not visit public areas.  Take care of yourself. Get rest and stay hydrated. Take over-the-counter medicines, such as acetaminophen, to help you feel better.  Stay in touch with your doctor. Call before you get medical care. Be sure to get care if you have trouble breathing, or have any other emergency warning signs, or if you think it is an emergency.  Avoid public transportation, ride-sharing, or taxis. Separate yourself from other people As much as possible, stay in a specific room and away from other people and pets in your home. If possible, you should use a separate bathroom. If you need to be around other people or animals in or outside of the home, wear a mask. Tell your close contactsthat they may have been exposed to COVID-19. An infected person can spread COVID-19 starting 48 hours (or 2 days) before the person has any symptoms or tests positive. By letting your close contacts know they may have been exposed to COVID-19, you are helping to protect everyone.  Additional guidance is available for those living in close quarters and shared housing.  See COVID-19 and Animals if you have questions about pets.  If you are diagnosed with COVID-19, someone from the health  department may call you. Answer the call to slow the spread. Monitor your symptoms  Symptoms of COVID-19 include fever, cough, or other symptoms.  Follow care instructions from your healthcare provider and local health department. Your local health authorities may give instructions on checking your symptoms and reporting information. When to seek emergency medical attention Look for emergency warning signs* for COVID-19. If someone is showing any of these signs, seek emergency medical care immediately:  Trouble breathing  Persistent pain or pressure in the chest  New confusion  Inability to wake or stay awake  Pale, gray, or blue-colored skin, lips, or nail beds, depending on skin tone *This list is not all possible symptoms. Please call your medical provider for any other symptoms that are severe or concerning to you. Call 911 or call ahead to your local emergency facility: Notify the operator that you are seeking care for someone who has or may have COVID-19. Call ahead before visiting your doctor  Call ahead. Many medical visits for routine care are being postponed or done by phone or telemedicine.  If you have a medical appointment that cannot be postponed, call your doctor's office, and tell them you have or may have COVID-19. This will help the office protect themselves and other patients. Get  tested  If you have symptoms of COVID-19, get tested. While waiting for test results, you stay away from others, including staying apart from those living in your household.  You can visit your state,   tribal, local, and territorialhealth department's website to look for the latest local information on testing sites. If you are sick, wear a mask over your nose and mouth  You should wear a mask over your nose and mouth if you must be around other people or animals, including pets (even at home).  You don't need to wear the mask if you are alone. If you can't put on a mask (because of  trouble breathing, for example), cover your coughs and sneezes in some other way. Try to stay at least 6 feet away from other people. This will help protect the people around you.  Masks should not be placed on young children under age 2 years, anyone who has trouble breathing, or anyone who is not able to remove the mask without help. Note: During the COVID-19 pandemic, medical grade facemasks are reserved for healthcare workers and some first responders. Cover your coughs and sneezes  Cover your mouth and nose with a tissue when you cough or sneeze.  Throw away used tissues in a lined trash can.  Immediately wash your hands with soap and water for at least 20 seconds. If soap and water are not available, clean your hands with an alcohol-based hand sanitizer that contains at least 60% alcohol. Clean your hands often  Wash your hands often with soap and water for at least 20 seconds. This is especially important after blowing your nose, coughing, or sneezing; going to the bathroom; and before eating or preparing food.  Use hand sanitizer if soap and water are not available. Use an alcohol-based hand sanitizer with at least 60% alcohol, covering all surfaces of your hands and rubbing them together until they feel dry.  Soap and water are the best option, especially if hands are visibly dirty.  Avoid touching your eyes, nose, and mouth with unwashed hands.  Handwashing Tips Avoid sharing personal household items  Do not share dishes, drinking glasses, cups, eating utensils, towels, or bedding with other people in your home.  Wash these items thoroughly after using them with soap and water or put in the dishwasher. Clean all "high-touch" surfaces everyday  Clean and disinfect high-touch surfaces in your "sick room" and bathroom; wear disposable gloves. Let someone else clean and disinfect surfaces in common areas, but you should clean your bedroom and bathroom, if possible.  If a  caregiver or other person needs to clean and disinfect a sick person's bedroom or bathroom, they should do so on an as-needed basis. The caregiver/other person should wear a mask and disposable gloves prior to cleaning. They should wait as long as possible after the person who is sick has used the bathroom before coming in to clean and use the bathroom. ? High-touch surfaces include phones, remote controls, counters, tabletops, doorknobs, bathroom fixtures, toilets, keyboards, tablets, and bedside tables.  Clean and disinfect areas that may have blood, stool, or body fluids on them.  Use household cleaners and disinfectants. Clean the area or item with soap and water or another detergent if it is dirty. Then, use a household disinfectant. ? Be sure to follow the instructions on the label to ensure safe and effective use of the product. Many products recommend keeping the surface wet for several minutes to ensure germs are killed. Many also recommend precautions such as wearing gloves and making sure you have good ventilation during use of the product. ? Use a product from EPA's List N: Disinfectants for Coronavirus (COVID-19). ? Complete Disinfection Guidance When you can   be around others after being sick with COVID-19 Deciding when you can be around others is different for different situations. Find out when you can safely end home isolation. For any additional questions about your care, contact your healthcare provider or state or local health department. 11/21/2019 Content source: National Center for Immunization and Respiratory Diseases (NCIRD), Division of Viral Diseases This information is not intended to replace advice given to you by your health care provider. Make sure you discuss any questions you have with your health care provider. Document Revised: 07/07/2020 Document Reviewed: 07/07/2020 Elsevier Patient Education  2021 Elsevier Inc.   If you have lab work done today you will be  contacted with your lab results within the next 2 weeks.  If you have not heard from us then please contact us. The fastest way to get your results is to register for My Chart.   IF you received an x-ray today, you will receive an invoice from [facility]. Please contact [facility] at [phone] with questions or concerns regarding your invoice.   IF you received labwork today, you will receive an invoice from [facility]. Please contact [facility] at [phone] with questions or concerns regarding your invoice.   Our billing staff will not be able to assist you with questions regarding bills from these companies.  You will be contacted with the lab results as soon as they are available. The fastest way to get your results is to activate your My Chart account. Instructions are located on the last page of this paperwork. If you have not heard from us regarding the results in 2 weeks, please contact this office.      

## 2020-10-24 IMAGING — NM NM HEPATO W/GB/PHARM/[PERSON_NAME]
2 series · 12 of 12 positions shown · non-contrast
Comparison: Ultrasound 10/23/2019

CLINICAL DATA: Abdominal pain fatty food intolerance

EXAM:
NUCLEAR MEDICINE HEPATOBILIARY IMAGING WITH GALLBLADDER EF
TECHNIQUE: Sequential images of the abdomen were obtained [DATE] minutes
following intravenous administration of radiopharmaceutical. After
oral ingestion of Ensure, gallbladder ejection fraction was
determined. At 60 min, normal ejection fraction is greater than 33%.
RADIOPHARMACEUTICALS:  5.16 mCi Jc-LLm  Choletec IV

[he hepatobiliary · 4.52mm/px · 6 of 60 frames shown (1 of 2)]
[frame 6/60]
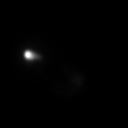
[frame 16/60]
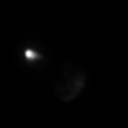
[frame 26/60]
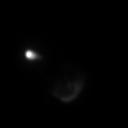
[frame 36/60]
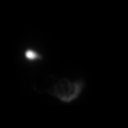
[frame 46/60]
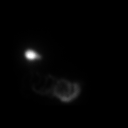
[frame 56/60]
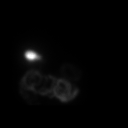

[he hepatobiliary · 4.52mm/px · 6 of 60 frames shown (2 of 2)]
[frame 6/60]
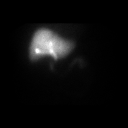
[frame 16/60]
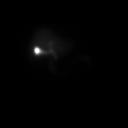
[frame 26/60]
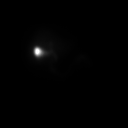
[frame 36/60]
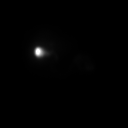
[frame 46/60]
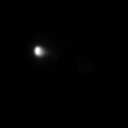
[frame 56/60]
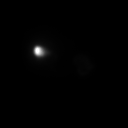

[12 of 12 positions shown; findings below may reference images not displayed]

FINDINGS: Prompt uptake and biliary excretion of activity by the liver is
seen. Gallbladder activity is visualized, consistent with patency of
cystic duct. Biliary activity passes into small bowel, consistent
with patent common bile duct.

Calculated gallbladder ejection fraction is 50%. (Normal gallbladder
ejection fraction with Ensure is greater than 33%.)
IMPRESSION: Negative examination

## 2020-11-25 DIAGNOSIS — I088 Other rheumatic multiple valve diseases: Secondary | ICD-10-CM | POA: Diagnosis not present

## 2020-11-25 DIAGNOSIS — Z8774 Personal history of (corrected) congenital malformations of heart and circulatory system: Secondary | ICD-10-CM | POA: Diagnosis not present

## 2020-11-25 DIAGNOSIS — Q213 Tetralogy of Fallot: Secondary | ICD-10-CM | POA: Diagnosis not present

## 2020-11-25 DIAGNOSIS — Z6841 Body Mass Index (BMI) 40.0 and over, adult: Secondary | ICD-10-CM | POA: Diagnosis not present

## 2020-12-03 DIAGNOSIS — Z20822 Contact with and (suspected) exposure to covid-19: Secondary | ICD-10-CM | POA: Diagnosis not present

## 2020-12-03 DIAGNOSIS — R059 Cough, unspecified: Secondary | ICD-10-CM | POA: Diagnosis not present

## 2020-12-03 DIAGNOSIS — R5383 Other fatigue: Secondary | ICD-10-CM | POA: Diagnosis not present

## 2020-12-03 DIAGNOSIS — R0982 Postnasal drip: Secondary | ICD-10-CM | POA: Diagnosis not present

## 2021-06-09 ENCOUNTER — Other Ambulatory Visit: Payer: Self-pay

## 2021-06-09 ENCOUNTER — Encounter: Payer: Self-pay | Admitting: Podiatry

## 2021-06-09 ENCOUNTER — Ambulatory Visit: Payer: BC Managed Care – PPO | Admitting: Podiatry

## 2021-06-09 ENCOUNTER — Ambulatory Visit (INDEPENDENT_AMBULATORY_CARE_PROVIDER_SITE_OTHER): Payer: BC Managed Care – PPO

## 2021-06-09 ENCOUNTER — Other Ambulatory Visit: Payer: Self-pay | Admitting: Podiatry

## 2021-06-09 DIAGNOSIS — M2140 Flat foot [pes planus] (acquired), unspecified foot: Secondary | ICD-10-CM

## 2021-06-09 DIAGNOSIS — M722 Plantar fascial fibromatosis: Secondary | ICD-10-CM

## 2021-06-09 DIAGNOSIS — R1011 Right upper quadrant pain: Secondary | ICD-10-CM | POA: Insufficient documentation

## 2021-06-09 DIAGNOSIS — M778 Other enthesopathies, not elsewhere classified: Secondary | ICD-10-CM

## 2021-06-09 DIAGNOSIS — K600 Acute anal fissure: Secondary | ICD-10-CM | POA: Insufficient documentation

## 2021-06-09 DIAGNOSIS — R194 Change in bowel habit: Secondary | ICD-10-CM | POA: Insufficient documentation

## 2021-06-09 DIAGNOSIS — R140 Abdominal distension (gaseous): Secondary | ICD-10-CM | POA: Insufficient documentation

## 2021-06-09 DIAGNOSIS — K219 Gastro-esophageal reflux disease without esophagitis: Secondary | ICD-10-CM | POA: Insufficient documentation

## 2021-06-09 DIAGNOSIS — K625 Hemorrhage of anus and rectum: Secondary | ICD-10-CM | POA: Insufficient documentation

## 2021-06-09 MED ORDER — MELOXICAM 15 MG PO TABS: 15.0000 mg | ORAL_TABLET | Freq: Every day | ORAL | 3 refills | Status: DC

## 2021-06-09 MED ORDER — METHYLPREDNISOLONE 4 MG PO TBPK: ORAL_TABLET | ORAL | 0 refills | Status: DC

## 2021-06-10 NOTE — Progress Notes (Signed)
    Subjective:  Patient ID: Jonathan Cunningham, male    DOB: 1986-01-27,  MRN: [id number] HPI Chief Complaint  Patient presents with   Foot Pain    Plantar (arch) and ankle bilateral (R>L) - aching x years intermittently, works 10 hours per day standing, wears ankle brace on right   New Patient (Initial Visit)    35 y.o. male presents with the above complaint.   ROS: Denies fever chills nausea vomiting muscle aches pains calf pain back pain chest pain shortness of breath.  Past Medical History:  Diagnosis Date   Diarrhea 07/19/2019   GERD (gastroesophageal reflux disease)    Heart murmur    Past Surgical History:  Procedure Laterality Date   CARDIAC SURGERY      Current Outpatient Medications:    meloxicam (MOBIC) 15 MG tablet, Take 1 tablet (15 mg total) by mouth daily., Disp: 30 tablet, Rfl: 3   methylPREDNISolone (MEDROL DOSEPAK) 4 MG TBPK tablet, 6 day dose pack - take as directed, Disp: 21 tablet, Rfl: 0   acetaminophen (TYLENOL) 500 MG tablet, Take 1 tablet (500 mg total) by mouth every 6 (six) hours as needed., Disp: 30 tablet, Rfl: 0   cetirizine (ZYRTEC) 10 MG tablet, Take 10 mg by mouth daily as needed for allergies. , Disp: , Rfl:    famotidine (PEPCID) 40 MG tablet, Take 40 mg by mouth 2 (two) times daily., Disp: , Rfl:    fluticasone (FLONASE) 50 MCG/ACT nasal spray, Place 2 sprays into both nostrils daily., Disp: 16 g, Rfl: 6   Omega-3 Fatty Acids (FISH OIL) 1200 MG CAPS, Take by mouth., Disp: , Rfl:   No Known Allergies Review of Systems Objective:  There were no vitals filed for this visit.  General: Well developed, nourished, in no acute distress, alert and oriented x3   Dermatological: Skin is warm, dry and supple bilateral. Nails x 10 are well maintained; remaining integument appears unremarkable at this time. There are no open sores, no preulcerative lesions, no rash or signs of infection present.  Vascular: Dorsalis Pedis artery and Posterior Tibial artery  pedal pulses are 2/4 bilateral with immedate capillary fill time. Pedal hair growth present. No varicosities and no lower extremity edema present bilateral.   Neruologic: Grossly intact via light touch bilateral. Vibratory intact via tuning fork bilateral. Protective threshold with Semmes Wienstein monofilament intact to all pedal sites bilateral. Patellar and Achilles deep tendon reflexes 2+ bilateral. No Babinski or clonus noted bilateral.   Musculoskeletal: No gross boney pedal deformities bilateral. No pain, crepitus, or limitation noted with foot and ankle range of motion bilateral. Muscular strength 5/5 in all groups tested bilateral.  No reproducible pain on palpation or manipulation of the foot and ankle.  Gait: Unassisted, Nonantalgic.    Radiographs:  Radiographs taken today demonstrate an osseously mature individual soft tissue increase in density plantar fashion calcaneal insertion site in the Achilles bilaterally to but then no significant osseous abnormalities other than mild to moderate pes planovalgus.  Assessment & Plan:   Assessment: Planter fasciitis and pes planovalgus bilateral.  Generalized foot fatigue and leg fatigue.  Plan: Schedule him for orthotics and started him on methylprednisolone to be followed by meloxicam.     [place] T. [place], [place]

## 2021-06-27 IMAGING — DX DG HIP (WITH OR WITHOUT PELVIS) 2-3V*R*
4 series · 4 of 4 positions shown · non-contrast
Comparison: None.

CLINICAL DATA: Low back pain without known injury.

EXAM:
DG HIP (WITH OR WITHOUT PELVIS) 2-3V RIGHT

[pelvis ap (1 of 2)]
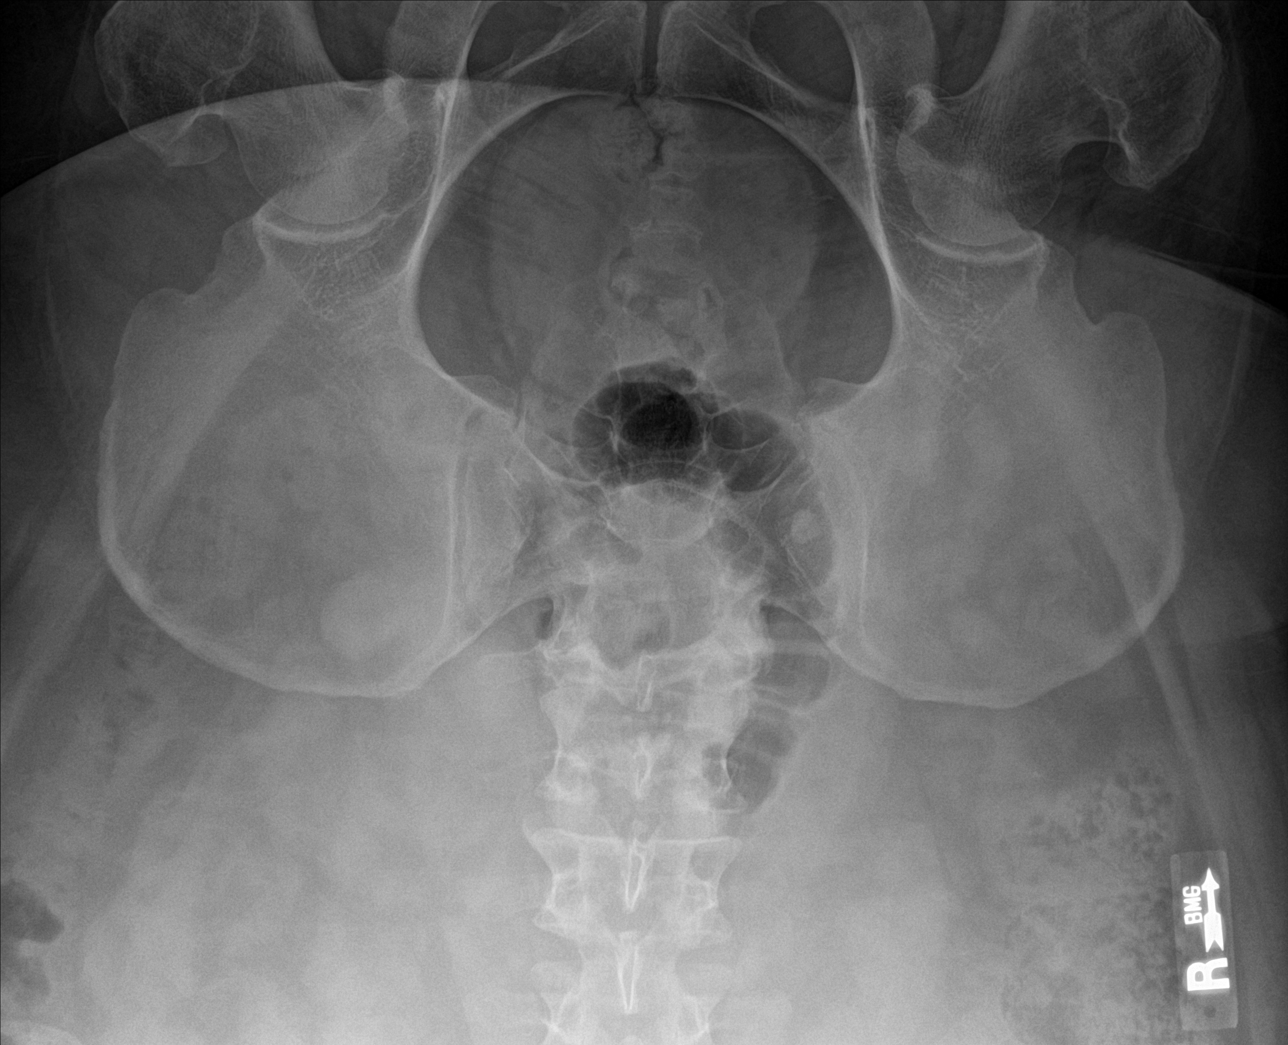

[hip ap]
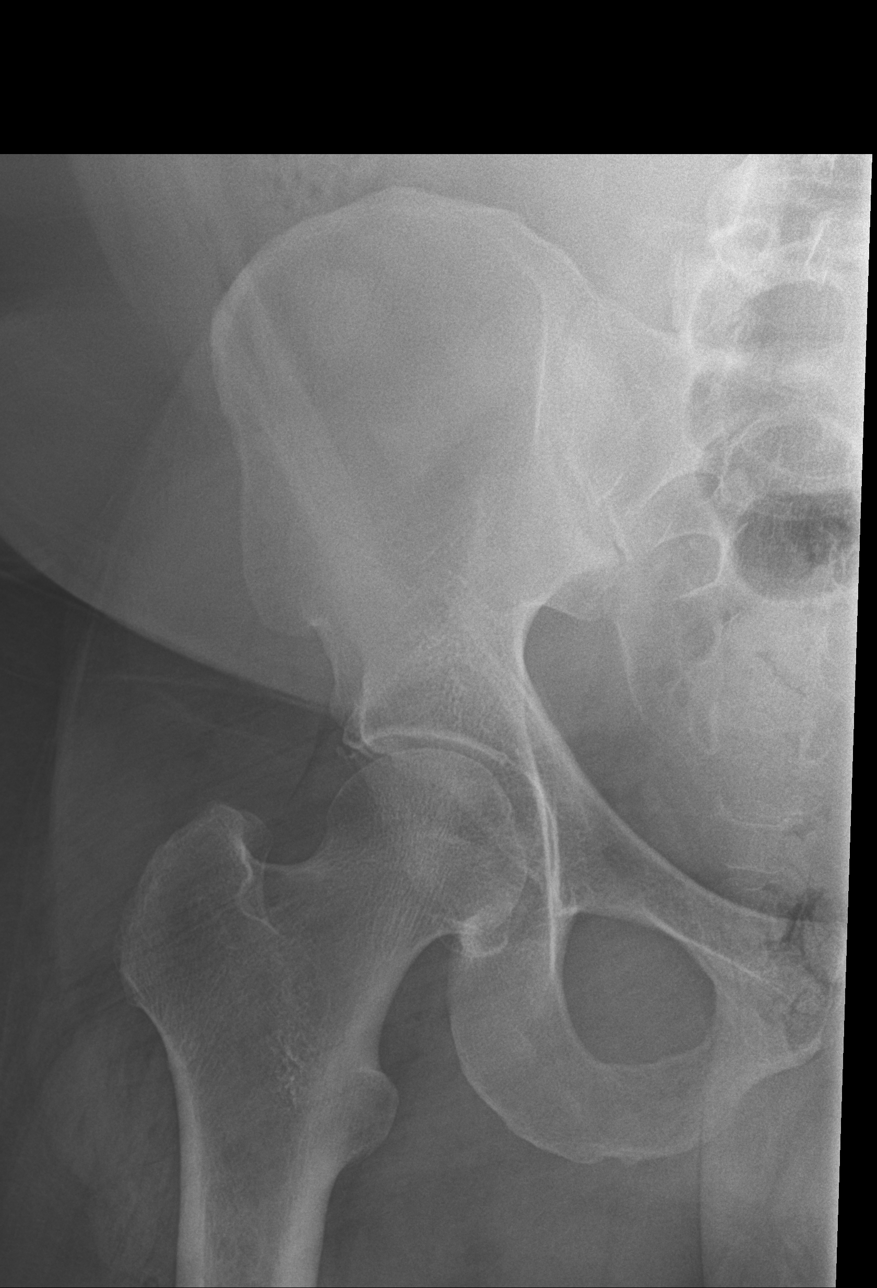

[hip lat]
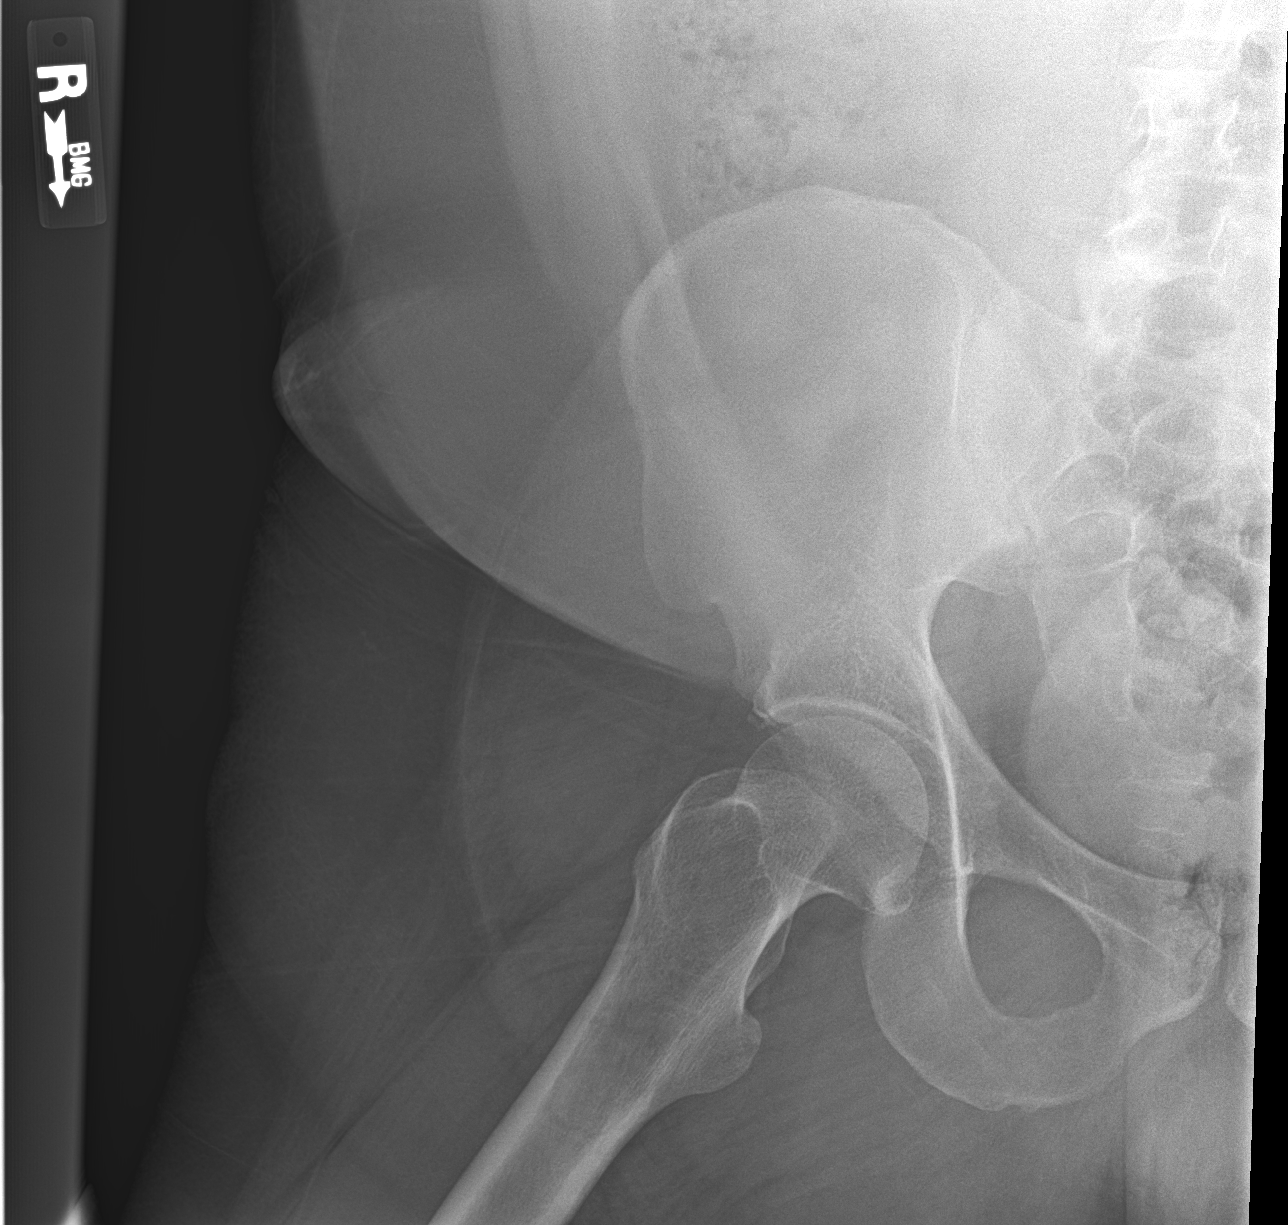

[pelvis ap (2 of 2)]
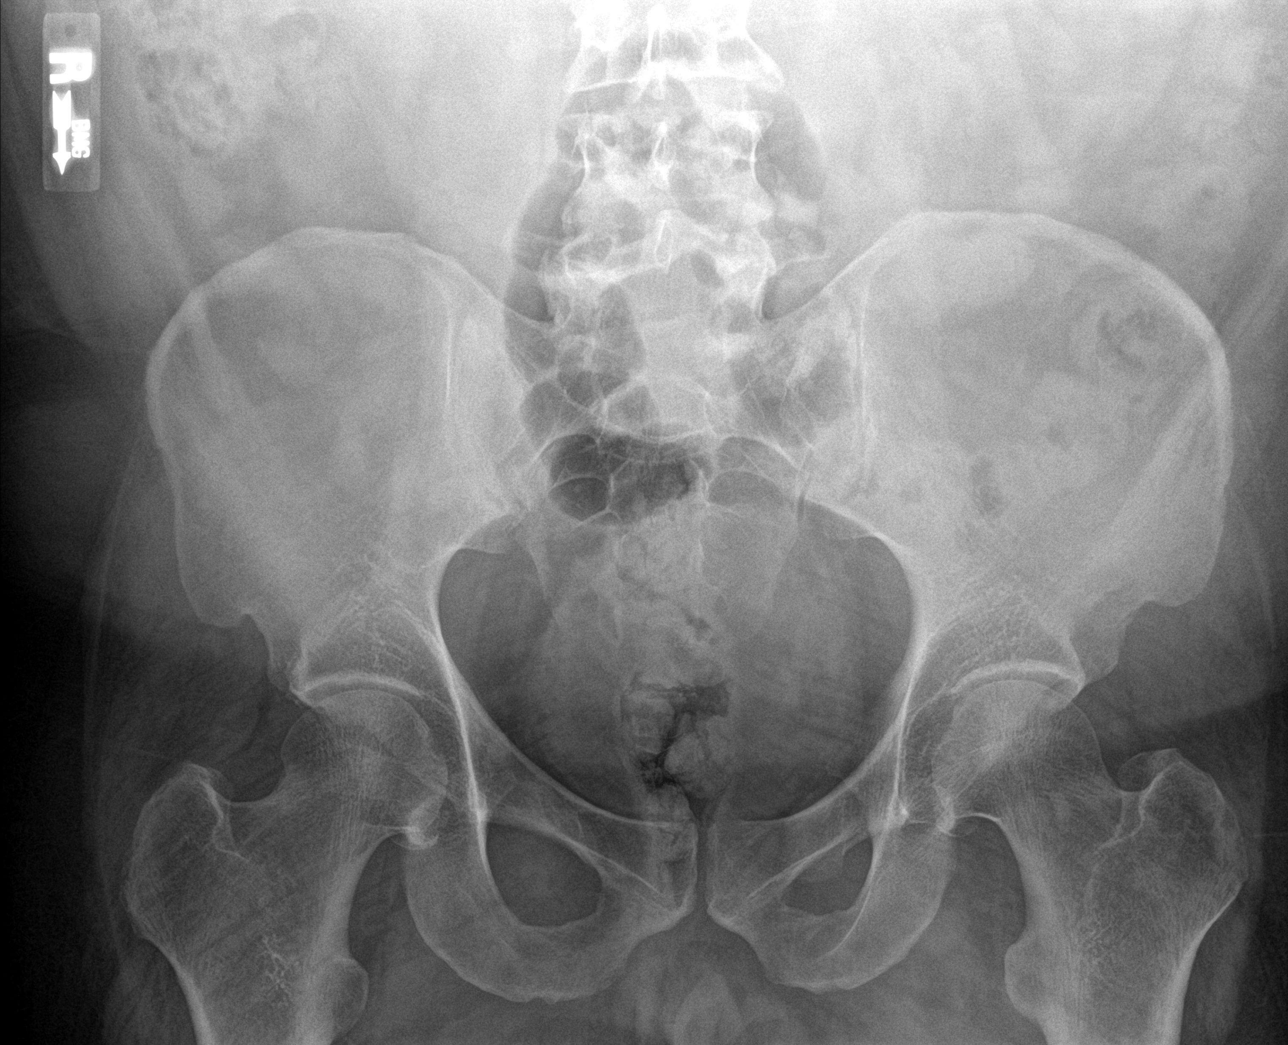

[4 of 4 positions shown; findings below may reference images not displayed]

FINDINGS: There is no evidence of hip fracture or dislocation. There is no
evidence of arthropathy or other focal bone abnormality.
IMPRESSION: Negative.

## 2021-07-10 ENCOUNTER — Telehealth: Payer: Self-pay | Admitting: Podiatry

## 2021-07-10 NOTE — Telephone Encounter (Signed)
Pt called asking about if his insurance would cover the orthotics that he is scheduled to be measured for them on 11.11.   I told him [employer]bs usually covers them and they are [phone]. He is wanting to call his insurance and I sent him a my chart message with the codes needed.

## 2021-07-17 ENCOUNTER — Other Ambulatory Visit: Payer: BC Managed Care – PPO

## 2021-09-16 ENCOUNTER — Telehealth: Payer: Self-pay

## 2021-09-16 ENCOUNTER — Other Ambulatory Visit: Payer: Self-pay | Admitting: Radiology

## 2021-09-16 DIAGNOSIS — Q213 Tetralogy of Fallot: Secondary | ICD-10-CM

## 2021-09-16 DIAGNOSIS — Z8774 Personal history of (corrected) congenital malformations of heart and circulatory system: Secondary | ICD-10-CM

## 2021-09-16 NOTE — Telephone Encounter (Signed)
SCANNED NOTES TO REFERRAL ?

## 2021-09-22 ENCOUNTER — Other Ambulatory Visit: Payer: Self-pay

## 2021-09-22 ENCOUNTER — Ambulatory Visit (INDEPENDENT_AMBULATORY_CARE_PROVIDER_SITE_OTHER): Payer: BC Managed Care – PPO

## 2021-09-22 DIAGNOSIS — Z8774 Personal history of (corrected) congenital malformations of heart and circulatory system: Secondary | ICD-10-CM

## 2021-09-22 DIAGNOSIS — Q213 Tetralogy of Fallot: Secondary | ICD-10-CM | POA: Diagnosis not present

## 2021-09-22 LAB — EXERCISE TOLERANCE TEST
Angina Index: 0
Estimated workload: 6.5
Exercise duration (min): 4 min
Exercise duration (sec): 37 s
MPHR: 187 {beats}/min
Peak HR: 171 {beats}/min
Percent HR: 91 %
RPE: 15
Rest HR: 81 {beats}/min

## 2021-11-11 ENCOUNTER — Other Ambulatory Visit: Payer: Self-pay

## 2021-11-11 ENCOUNTER — Encounter: Payer: Self-pay | Admitting: Registered Nurse

## 2021-11-11 ENCOUNTER — Telehealth (INDEPENDENT_AMBULATORY_CARE_PROVIDER_SITE_OTHER): Payer: Self-pay | Admitting: Registered Nurse

## 2021-11-11 DIAGNOSIS — R1114 Bilious vomiting: Secondary | ICD-10-CM

## 2021-11-11 MED ORDER — ONDANSETRON 4 MG PO TBDP: 4.0000 mg | ORAL_TABLET | Freq: Three times a day (TID) | ORAL | 0 refills | Status: DC | PRN

## 2021-11-11 NOTE — Progress Notes (Signed)
? ? ?Telemedicine Encounter- SOAP NOTE Established Patient ? ?This Video encounter was conducted with the patient's (or proxy's) verbal consent via audio telecommunications: yes/no: Yes ?Patient was instructed to have this encounter in a suitably private space; and to only have persons present to whom they give permission to participate. In addition, patient identity was confirmed by use of name plus two identifiers (DOB and address).  I discussed the limitations, risks, security and privacy concerns of performing an evaluation and management service by telephone and the availability of in person appointments. I also discussed with the patient that there may be a patient responsible charge related to this service. The patient expressed understanding and agreed to proceed. ? ?I spent a total of 15 minutes talking with the patient or their proxy. ? ?Patient at home ?Provider in office ? ?Participants: Jonathan Sportsman, NP and Julaine Hua ? ?Chief Complaint  ?Patient presents with  ? Emesis  ?  Patient states he thinks he has a stomach bug he has been vomiting since 2:00 am , stomach pain , and now has a chill.  ? ? ?Subjective  ? ?Jonathan Cunningham is a 36 y.o. established patient. Video visit today for emesis.  ? ?HPI ?Stomach pain and emesis starting this morning at 2am.  ?Notes he ate at a truck stop buffet yesterday, no one else got sick to his knowledge.  ?Notes some diarrhea. More vomiting and dry heaving. ?No blood in vomit or stool. Denies melena and coffee ground emesis.  ? ?Patient Active Problem List  ? Diagnosis Date Noted  ? Abdominal bloating 06/09/2021  ? Acute anal fissure 06/09/2021  ? Bright red blood per rectum 06/09/2021  ? Change in bowel habit 06/09/2021  ? Gastroesophageal reflux disease without esophagitis 06/09/2021  ? Rectal bleeding 06/09/2021  ? Right upper quadrant pain 06/09/2021  ? Obesity 11/14/2019  ? Diarrhea 07/19/2019  ? Testicular mass 04/18/2019  ? Pulmonary valve stenosis 10/22/2017   ? Tetralogy of Fallot 02/09/2015  ? Tetralogy of Fallot s/p repair 02/14/2012  ? ? ?Past Medical History:  ?Diagnosis Date  ? Diarrhea 07/19/2019  ? GERD (gastroesophageal reflux disease)   ? Heart murmur   ? ? ?Current Outpatient Medications  ?Medication Sig Dispense Refill  ? cetirizine (ZYRTEC) 10 MG tablet Take 10 mg by mouth daily as needed for allergies.     ? fluticasone (FLONASE) 50 MCG/ACT nasal spray Place 2 sprays into both nostrils daily. 16 g 6  ? ondansetron (ZOFRAN-ODT) 4 MG disintegrating tablet Take 1 tablet (4 mg total) by mouth every 8 (eight) hours as needed for nausea or vomiting. 20 tablet 0  ? acetaminophen (TYLENOL) 500 MG tablet Take 1 tablet (500 mg total) by mouth every 6 (six) hours as needed. (Patient not taking: Reported on 11/11/2021) 30 tablet 0  ? famotidine (PEPCID) 40 MG tablet Take 40 mg by mouth 2 (two) times daily. (Patient not taking: Reported on 11/11/2021)    ? meloxicam (MOBIC) 15 MG tablet Take 1 tablet (15 mg total) by mouth daily. (Patient not taking: Reported on 11/11/2021) 30 tablet 3  ? methylPREDNISolone (MEDROL DOSEPAK) 4 MG TBPK tablet 6 day dose pack - take as directed (Patient not taking: Reported on 11/11/2021) 21 tablet 0  ? Omega-3 Fatty Acids (FISH OIL) 1200 MG CAPS Take by mouth. (Patient not taking: Reported on 11/11/2021)    ? ?No current facility-administered medications for this visit.  ? ? ?No Known Allergies ? ?Social History  ? ?Socioeconomic  History  ? Marital status: Single  ?  Spouse name: Not on file  ? Number of children: Not on file  ? Years of education: Not on file  ? Highest education level: Not on file  ?Occupational History  ? Not on file  ?Tobacco Use  ? Smoking status: Never  ? Smokeless tobacco: Never  ?Substance and Sexual Activity  ? Alcohol use: No  ?  Alcohol/week: 0.0 standard drinks  ? Drug use: No  ? Sexual activity: Not on file  ?Other Topics Concern  ? Not on file  ?Social History Narrative  ? Not on file  ? ?Social Determinants of Health   ? ?Financial Resource Strain: Not on file  ?Food Insecurity: Not on file  ?Transportation Needs: Not on file  ?Physical Activity: Not on file  ?Stress: Not on file  ?Social Connections: Not on file  ?Intimate Partner Violence: Not on file  ? ? ?Review of Systems  ?Constitutional:  Positive for chills and diaphoresis. Negative for fever, malaise/fatigue and weight loss.  ?HENT: Negative.    ?Eyes: Negative.   ?Respiratory: Negative.    ?Cardiovascular: Negative.   ?Gastrointestinal:  Positive for abdominal pain, diarrhea, nausea and vomiting. Negative for blood in stool, constipation, heartburn and melena.  ?Genitourinary: Negative.   ?Musculoskeletal: Negative.   ?Skin: Negative.   ?Neurological: Negative.   ?Endo/Heme/Allergies: Negative.   ?Psychiatric/Behavioral: Negative.    ?All other systems reviewed and are negative. ? ?Objective  ? ?Vitals as reported by the patient: ?There were no vitals filed for this visit. ? ?Jonathan Cunningham was seen today for emesis. ? ?Diagnoses and all orders for this visit: ? ?Bilious vomiting with nausea ?-     Discontinue: ondansetron (ZOFRAN-ODT) 4 MG disintegrating tablet; Take 1 tablet (4 mg total) by mouth every 8 (eight) hours as needed for nausea or vomiting. ?-     ondansetron (ZOFRAN-ODT) 4 MG disintegrating tablet; Take 1 tablet (4 mg total) by mouth every 8 (eight) hours as needed for nausea or vomiting. ? ? ? ?PLAN ?Suspect viral illness. Will give zofran to help him keep down fluids, bland foods ?He will call if not improving significantly within around 36 hours from initial onset of illness. ?ER precautions discussed ?Patient encouraged to call clinic with any questions, comments, or concerns. ? ?I discussed the assessment and treatment plan with the patient. The patient was provided an opportunity to ask questions and all were answered. The patient agreed with the plan and demonstrated an understanding of the instructions. ?  ?The patient was advised to call back or seek an  in-person evaluation if the symptoms worsen or if the condition fails to improve as anticipated. ? ?I provided 15 minutes of face-to-face time during this encounter. ? ?Janeece Agee, NP ? ?

## 2022-03-01 ENCOUNTER — Ambulatory Visit (INDEPENDENT_AMBULATORY_CARE_PROVIDER_SITE_OTHER): Payer: Self-pay | Admitting: Family Medicine

## 2022-03-01 ENCOUNTER — Encounter: Payer: Self-pay | Admitting: Family Medicine

## 2022-03-01 VITALS — BP 130/76 | HR 99 | Temp 98.2°F | Resp 16 | Ht 65.5 in | Wt 315.0 lb

## 2022-03-01 DIAGNOSIS — R197 Diarrhea, unspecified: Secondary | ICD-10-CM

## 2022-03-01 MED ORDER — DICYCLOMINE HCL 10 MG PO CAPS: 10.0000 mg | ORAL_CAPSULE | Freq: Three times a day (TID) | ORAL | 0 refills | Status: DC

## 2022-03-01 MED ORDER — AZITHROMYCIN 500 MG PO TABS: 500.0000 mg | ORAL_TABLET | Freq: Every day | ORAL | 0 refills | Status: DC

## 2022-03-01 MED ORDER — LIDOCAINE (ANORECTAL) 5 % EX GEL: CUTANEOUS | 0 refills | Status: DC

## 2022-03-01 NOTE — Progress Notes (Signed)
Subjective:  Patient ID: Jonathan Cunningham, male    DOB: 1985-09-26  Age: 36 y.o. MRN: [id number]  CC:  Chief Complaint  Patient presents with   Diarrhea    Stomach cramping, gurgling feeling, notes diarrhea has been dealing with this apx 10 days, was previously given dicyclomine     HPI Jonathan Cunningham presents for   Diarrhea, abdominal cramping: Felt normal self 2 days ago.  Initial symptoms yesterday am - frequent bowel movements. Small stools, small amounts.min watery stool, more small unformed stools.(Describes bristol scale 6 stools). 6 episodes during day - red appearance.  10 last night. Few episodes today - slight better. ? Chills during BM, but no fever.  Some blood  in stool, but fissures, hemorrhoids in past. Sore around anus. Blood resolved today.  Gas, frequent cramps. No fever, n/v.  Last abx a month ago.  No recent hospitalizations. No recent foreign travel.  Last week has changed diet - trying to eat healthier eating more chicken, broccoli, ground [place] recently. no sick contacts. No attempted treatments. Bentyl helped a few years ago with cramping.      History Patient Active Problem List   Diagnosis Date Noted   Abdominal bloating 06/09/2021   Acute anal fissure 06/09/2021   Bright red blood per rectum 06/09/2021   Change in bowel habit 06/09/2021   Gastroesophageal reflux disease without esophagitis 06/09/2021   Rectal bleeding 06/09/2021   Right upper quadrant pain 06/09/2021   Obesity 11/14/2019   Diarrhea 07/19/2019   Testicular mass 04/18/2019   Pulmonary valve stenosis 10/22/2017   Tetralogy of Fallot 02/09/2015   Tetralogy of Fallot s/p repair 02/14/2012   Past Medical History:  Diagnosis Date   Diarrhea 07/19/2019   GERD (gastroesophageal reflux disease)    Heart murmur    Past Surgical History:  Procedure Laterality Date   CARDIAC SURGERY     No Known Allergies Prior to Admission medications   Medication Sig Start Date End Date Taking?  Authorizing Provider  cetirizine (ZYRTEC) 10 MG tablet Take 10 mg by mouth daily as needed for allergies.    Yes [provider]  ondansetron (ZOFRAN-ODT) 4 MG disintegrating tablet Take 1 tablet (4 mg total) by mouth every 8 (eight) hours as needed for nausea or vomiting. 11/11/21  Yes Janeece Agee, NP  acetaminophen (TYLENOL) 500 MG tablet Take 1 tablet (500 mg total) by mouth every 6 (six) hours as needed. Patient not taking: Reported on 11/11/2021 11/16/14   Ofilia Neas, PA-C  famotidine (PEPCID) 40 MG tablet Take 40 mg by mouth 2 (two) times daily. Patient not taking: Reported on 11/11/2021 10/11/19   [provider]  fluticasone (FLONASE) 50 MCG/ACT nasal spray Place 2 sprays into both nostrils daily. Patient not taking: Reported on 03/01/2022 09/17/20   Just, Azalee Course, FNP  meloxicam (MOBIC) 15 MG tablet Take 1 tablet (15 mg total) by mouth daily. Patient not taking: Reported on 11/11/2021 06/09/21   Hyatt, Max T, DPM  methylPREDNISolone (MEDROL DOSEPAK) 4 MG TBPK tablet 6 day dose pack - take as directed Patient not taking: Reported on 11/11/2021 06/09/21   Hyatt, Max T, DPM  Omega-3 Fatty Acids (FISH OIL) 1200 MG CAPS Take by mouth. Patient not taking: Reported on 11/11/2021    [provider]   Social History   Socioeconomic History   Marital status: Single    Spouse name: Not on file   Number of children: Not on file   Years of  education: Not on file   Highest education level: Not on file  Occupational History   Not on file  Tobacco Use   Smoking status: Never   Smokeless tobacco: Never  Substance and Sexual Activity   Alcohol use: No    Alcohol/week: 0.0 standard drinks of alcohol   Drug use: No   Sexual activity: Not on file  Other Topics Concern   Not on file  Social History Narrative   Not on file   Social Determinants of Health   Financial Resource Strain: Not on file  Food Insecurity: Not on file  Transportation Needs: Not on file  Physical  Activity: Not on file  Stress: Not on file  Social Connections: Not on file  Intimate Partner Violence: Not on file    Review of Systems Per HPI.   Objective:   Vitals:   03/01/22 1507  BP: 130/76  Pulse: 99  Resp: 16  Temp: 98.2 F (36.8 C)  TempSrc: Temporal  SpO2: 98%  Weight: (!) 315 lb (142.9 kg)  Height: 5' 5.5" (1.664 m)     Physical Exam Constitutional:      General: He is not in acute distress.    Appearance: Normal appearance. He is well-developed. He is not ill-appearing, toxic-appearing or diaphoretic.  HENT:     Head: Normocephalic and atraumatic.  Cardiovascular:     Rate and Rhythm: Normal rate.  Pulmonary:     Effort: Pulmonary effort is normal.  Abdominal:     General: There is no distension.     Tenderness: There is no abdominal tenderness. There is no right CVA tenderness, left CVA tenderness, guarding or rebound.     Comments: Slight hyperactive bowel sounds, no focal tenderness, no rebound or guarding.  Right upper quadrant, right lower quadrant nontender  Neurological:     Mental Status: He is alert and oriented to person, place, and time.  Psychiatric:        Mood and Affect: Mood normal.        Assessment & Plan:  Jonathan Cunningham is a 36 y.o. male . Diarrhea of presumed infectious origin - Plan: Lidocaine, Anorectal, 5 % GEL, azithromycin (ZITHROMAX) 500 MG tablet, dicyclomine (BENTYL) 10 MG capsule  -Suspected infectious diarrhea, possible food poisoning/foodborne illness.  Recent poultry, differential includes Campylobacter with bloody diarrhea versus bleeding from fissure.  Symptoms have slightly improved today.  Continue fluids, then bland food once symptoms have improved.  Dicyclomine low-dose as option but recommended not using that medication unless needed.  If symptoms not improving into tomorrow, start azithromycin for possible treatment of Campylobacter.  RTC/ER precautions discussed.  Potential side effects and risks of antibiotics  and dicyclomine discussed.  Meds ordered this encounter  Medications   Lidocaine, Anorectal, 5 % GEL    Sig: Apply pea-sized amount to the perianal skin up to 3 times per day as needed.    Dispense:  30 g    Refill:  0   azithromycin (ZITHROMAX) 500 MG tablet    Sig: Take 1 tablet (500 mg total) by mouth daily.    Dispense:  3 tablet    Refill:  0   dicyclomine (BENTYL) 10 MG capsule    Sig: Take 1 capsule (10 mg total) by mouth 4 (four) times daily -  before meals and at bedtime.    Dispense:  10 capsule    Refill:  0   Patient Instructions  Recent loose stools may have been related to a foodborne  illness.  This could have been related to either [place] or chicken so throw both of those foods out.  If you are not improving into tomorrow I would go ahead and fill the prescription for azithromycin antibiotic, taking that 1 pill a day for the next 3 days.  Make sure to drink plenty of fluids, broth, then start bland foods once your symptoms are improving but fluids are most important for now.  Dicyclomine only if needed for cramping.  Lidocaine gel for sore area or fissures if needed.  Return to the clinic or go to the nearest emergency room if any of your symptoms worsen or new symptoms occur.  Diarrhea, Adult Diarrhea is frequent loose and watery bowel movements. Diarrhea can make you feel weak and cause you to become dehydrated. Dehydration can make you tired and thirsty, cause you to have a dry mouth, and decrease how often you urinate. Diarrhea typically lasts 2-3 days. However, it can last longer if it is a sign of something more serious. It is important to treat your diarrhea as told by your health care provider. Follow these instructions at home: Eating and drinking     Follow these recommendations as told by your health care provider: Take an oral rehydration solution (ORS). This is an over-the-counter medicine that helps return your body to its normal balance of nutrients and  water. It is found at pharmacies and retail stores. Drink plenty of fluids, such as water, ice chips, diluted fruit juice, and low-calorie sports drinks. You can drink milk also, if desired. Avoid drinking fluids that contain a lot of sugar or caffeine, such as energy drinks, sports drinks, and soda. Eat bland, easy-to-digest foods in small amounts as you are able. These foods include bananas, applesauce, rice, lean meats, toast, and crackers. Avoid alcohol. Avoid spicy or fatty foods.  Medicines Take over-the-counter and prescription medicines only as told by your health care provider. If you were prescribed an antibiotic medicine, take it as told by your health care provider. Do not stop using the antibiotic even if you start to feel better. General instructions  Wash your hands often using soap and water. If soap and water are not available, use a hand sanitizer. Others in the household should wash their hands as well. Hands should be washed: After using the toilet or changing a diaper. Before preparing, cooking, or serving food. While caring for a sick person or while visiting someone in a hospital. Drink enough fluid to keep your urine pale yellow. Rest at home while you recover. Watch your condition for any changes. Take a warm bath to relieve any burning or pain from frequent diarrhea episodes. Keep all follow-up visits as told by your health care provider. This is important. Contact a health care provider if: You have a fever. Your diarrhea gets worse. You have new symptoms. You cannot keep fluids down. You feel light-headed or dizzy. You have a headache. You have muscle cramps. Get help right away if: You have chest pain. You feel extremely weak or you faint. You have bloody or black stools or stools that look like tar. You have severe pain, cramping, or bloating in your abdomen. You have trouble breathing or you are breathing very quickly. Your heart is beating very  quickly. Your skin feels cold and clammy. You feel confused. You have signs of dehydration, such as: Dark urine, very little urine, or no urine. Cracked lips. Dry mouth. Sunken eyes. Sleepiness. Weakness. Summary Diarrhea is frequent loose and  sometimes watery bowel movements. Diarrhea can make you feel weak and cause you to become dehydrated. Drink enough fluids to keep your urine pale yellow. Make sure that you wash your hands after using the toilet. If soap and water are not available, use hand sanitizer. Contact a health care provider if your diarrhea gets worse or you have new symptoms. Get help right away if you have signs of dehydration. This information is not intended to replace advice given to you by your health care provider. Make sure you discuss any questions you have with your health care provider. Document Revised: 03/04/2021 Document Reviewed: 03/04/2021 Elsevier Patient Education  2023 [employer]     Signed,   Meredith Staggers, MD [facility], [facility] 03/01/22 4:00 PM

## 2022-06-21 DIAGNOSIS — H6121 Impacted cerumen, right ear: Secondary | ICD-10-CM | POA: Insufficient documentation

## 2022-06-30 ENCOUNTER — Encounter: Payer: Self-pay | Admitting: Family Medicine

## 2022-07-19 DIAGNOSIS — I4510 Unspecified right bundle-branch block: Secondary | ICD-10-CM | POA: Diagnosis not present

## 2022-07-19 DIAGNOSIS — I370 Nonrheumatic pulmonary valve stenosis: Secondary | ICD-10-CM | POA: Diagnosis not present

## 2022-07-19 DIAGNOSIS — Q213 Tetralogy of Fallot: Secondary | ICD-10-CM | POA: Diagnosis not present

## 2022-07-19 DIAGNOSIS — Z9889 Other specified postprocedural states: Secondary | ICD-10-CM | POA: Diagnosis not present

## 2022-07-19 DIAGNOSIS — Q210 Ventricular septal defect: Secondary | ICD-10-CM | POA: Diagnosis not present

## 2022-08-24 ENCOUNTER — Encounter: Payer: Self-pay | Admitting: Family

## 2022-08-24 ENCOUNTER — Telehealth: Payer: Self-pay | Admitting: Family Medicine

## 2022-08-24 ENCOUNTER — Telehealth (INDEPENDENT_AMBULATORY_CARE_PROVIDER_SITE_OTHER): Payer: BC Managed Care – PPO | Admitting: Family

## 2022-08-24 VITALS — Temp 98.2°F | Ht 65.5 in | Wt 290.0 lb

## 2022-08-24 DIAGNOSIS — R051 Acute cough: Secondary | ICD-10-CM | POA: Diagnosis not present

## 2022-08-24 DIAGNOSIS — U071 COVID-19: Secondary | ICD-10-CM

## 2022-08-24 MED ORDER — NIRMATRELVIR/RITONAVIR (PAXLOVID)TABLET: 3.0000 | ORAL_TABLET | Freq: Two times a day (BID) | ORAL | 0 refills | Status: AC

## 2022-08-24 MED ORDER — PROMETHAZINE-DM 6.25-15 MG/5ML PO SYRP: 5.0000 mL | ORAL_SOLUTION | Freq: Four times a day (QID) | ORAL | 0 refills | Status: DC | PRN

## 2022-08-24 NOTE — Progress Notes (Signed)
Virtual Visit via Video   I connected with patient on 08/24/22 at  1:30 PM EST by a video enabled telemedicine application and verified that I am speaking with the correct person using two identifiers.  Location patient: Home Location provider: [facility], Office Persons participating in the virtual visit: Patient, Provider, CMA  I discussed the limitations of evaluation and management by telemedicine and the availability of in person appointments. The patient expressed understanding and agreed to proceed.  Subjective:   HPI:   36 year old male presents today via video visit with concerns of cough, congestion, fever x 2 days. He is positive for COVID by rapid antigen test. Reports working at [employer].  ROS:   See pertinent positives and negatives per HPI.  Patient Active Problem List   Diagnosis Date Noted   Abdominal bloating 06/09/2021   Acute anal fissure 06/09/2021   Bright red blood per rectum 06/09/2021   Change in bowel habit 06/09/2021   Gastroesophageal reflux disease without esophagitis 06/09/2021   Rectal bleeding 06/09/2021   Right upper quadrant pain 06/09/2021   Obesity 11/14/2019   Diarrhea 07/19/2019   Testicular mass 04/18/2019   Pulmonary valve stenosis 10/22/2017   Tetralogy of Fallot 02/09/2015   Tetralogy of Fallot s/p repair 02/14/2012    Social History   Tobacco Use   Smoking status: Never   Smokeless tobacco: Never  Substance Use Topics   Alcohol use: No    Alcohol/week: 0.0 standard drinks of alcohol    Current Outpatient Medications:    acetaminophen (TYLENOL) 325 MG tablet, Take by mouth., Disp: , Rfl:    acetaminophen (TYLENOL) 500 MG tablet, Take 1 tablet (500 mg total) by mouth every 6 (six) hours as needed., Disp: 30 tablet, Rfl: 0   Ascorbic Acid (VITAMIN C) 1000 MG tablet, Take 1,000 mg by mouth daily., Disp: , Rfl:    famotidine (PEPCID) 40 MG tablet, Take by mouth., Disp: , Rfl:    fluticasone (FLONASE) 50 MCG/ACT nasal  spray, 50 sprays daily as needed for allergies., Disp: , Rfl:    magnesium oxide (MAG-OX) 400 (240 Mg) MG tablet, Take 400 mg by mouth daily., Disp: , Rfl:    nirmatrelvir/ritonavir EUA (PAXLOVID) 20 x 150 MG & 10 x 100MG  TABS, Take 3 tablets by mouth 2 (two) times daily for 5 days. (Take nirmatrelvir 150 mg two tablets twice daily for 5 days and ritonavir 100 mg one tablet twice daily for 5 days) Patient GFR is 90, Disp: 30 tablet, Rfl: 0   Omega-3 Fatty Acids (FISH OIL) 1200 MG CAPS, Take by mouth., Disp: , Rfl:    promethazine-dextromethorphan (PROMETHAZINE-DM) 6.25-15 MG/5ML syrup, Take 5 mLs by mouth 4 (four) times daily as needed., Disp: 118 mL, Rfl: 0   azithromycin (ZITHROMAX) 500 MG tablet, Take 1 tablet (500 mg total) by mouth daily. (Patient not taking: Reported on 08/24/2022), Disp: 3 tablet, Rfl: 0   cetirizine (ZYRTEC) 10 MG tablet, Take 10 mg by mouth daily as needed for allergies.  (Patient not taking: Reported on 08/24/2022), Disp: , Rfl:    dicyclomine (BENTYL) 10 MG capsule, Take 1 capsule (10 mg total) by mouth 4 (four) times daily -  before meals and at bedtime. (Patient not taking: Reported on 08/24/2022), Disp: 10 capsule, Rfl: 0   famotidine (PEPCID) 40 MG tablet, Take 40 mg by mouth 2 (two) times daily. (Patient not taking: Reported on 11/11/2021), Disp: , Rfl:    fluticasone (FLONASE) 50 MCG/ACT nasal spray, Place  2 sprays into both nostrils daily. (Patient not taking: Reported on 03/01/2022), Disp: 16 g, Rfl: 6   Lidocaine, Anorectal, 5 % GEL, Apply pea-sized amount to the perianal skin up to 3 times per day as needed. (Patient not taking: Reported on 08/24/2022), Disp: 30 g, Rfl: 0   meloxicam (MOBIC) 15 MG tablet, Take 1 tablet (15 mg total) by mouth daily. (Patient not taking: Reported on 11/11/2021), Disp: 30 tablet, Rfl: 3   methylPREDNISolone (MEDROL DOSEPAK) 4 MG TBPK tablet, 6 day dose pack - take as directed (Patient not taking: Reported on 11/11/2021), Disp: 21 tablet, Rfl:  0   ondansetron (ZOFRAN-ODT) 4 MG disintegrating tablet, Take 1 tablet (4 mg total) by mouth every 8 (eight) hours as needed for nausea or vomiting. (Patient not taking: Reported on 08/24/2022), Disp: 20 tablet, Rfl: 0  No Known Allergies  Objective:   Temp 98.2 F (36.8 C)   Ht 5' 5.5" (1.664 m)   Wt 290 lb (131.5 kg)   BMI 47.52 kg/m   Patient is well-developed, well-nourished in no acute distress.  Resting comfortably at home.  Head is normocephalic, atraumatic.  No labored breathing.  Speech is clear and coherent with logical content.  Patient is alert and oriented at baseline.    Assessment and Plan:    Brand was seen today for covid positive.  Diagnoses and all orders for this visit:  COVID-19  Acute cough  Other orders -     promethazine-dextromethorphan (PROMETHAZINE-DM) 6.25-15 MG/5ML syrup; Take 5 mLs by mouth 4 (four) times daily as needed. -     nirmatrelvir/ritonavir EUA (PAXLOVID) 20 x 150 MG & 10 x 100MG  TABS; Take 3 tablets by mouth 2 (two) times daily for 5 days. (Take nirmatrelvir 150 mg two tablets twice daily for 5 days and ritonavir 100 mg one tablet twice daily for 5 days) Patient GFR is 90   Call the office if symptoms worsen or persist. Recheck as scheduled and sooner as needed.    , FNP 08/24/2022  Time spent with the patient: 25 minutes, of which >50% was spent in obtaining information about symptoms, reviewing previous labs, evaluations, and treatments, counseling about condition (please see the discussed topics above), and developing a plan to further investigate it; had a number of questions which I addressed.

## 2022-08-24 NOTE — Telephone Encounter (Signed)
Called LM to schedule an appt, we cannot rx paxlovid without a visit

## 2022-08-24 NOTE — Telephone Encounter (Signed)
Initial Comment Caller states he is positive for covid and would like paxlovid. Caller did deny triage. Patient request to speak to RN No Additional Comment Office hours provided. Caller did deny triage. Disp. Time Disposition Final User 08/24/2022 6:27:30 AM General Information Provided Yes Griffin Dakin Call Closed By: Griffin Dakin Transaction Date/Time: 08/24/2022 6:24:10 AM (ET)  Called patient to schedule a [facility] visit Hyman Hopes NP. No answer, LM for patient to call back to schedule his [facility] visit .

## 2022-08-26 DIAGNOSIS — Q213 Tetralogy of Fallot: Secondary | ICD-10-CM | POA: Diagnosis not present

## 2022-09-02 ENCOUNTER — Encounter: Payer: Self-pay | Admitting: Family Medicine

## 2023-04-05 DIAGNOSIS — R011 Cardiac murmur, unspecified: Secondary | ICD-10-CM | POA: Diagnosis not present

## 2023-04-05 DIAGNOSIS — Z130 Encounter for screening for diseases of the blood and blood-forming organs and certain disorders involving the immune mechanism: Secondary | ICD-10-CM | POA: Diagnosis not present

## 2023-04-05 DIAGNOSIS — E559 Vitamin D deficiency, unspecified: Secondary | ICD-10-CM | POA: Diagnosis not present

## 2023-04-05 DIAGNOSIS — R768 Other specified abnormal immunological findings in serum: Secondary | ICD-10-CM | POA: Diagnosis not present

## 2023-04-05 DIAGNOSIS — Z8774 Personal history of (corrected) congenital malformations of heart and circulatory system: Secondary | ICD-10-CM | POA: Diagnosis not present

## 2023-04-05 DIAGNOSIS — K219 Gastro-esophageal reflux disease without esophagitis: Secondary | ICD-10-CM | POA: Diagnosis not present

## 2023-04-05 DIAGNOSIS — Z6841 Body Mass Index (BMI) 40.0 and over, adult: Secondary | ICD-10-CM | POA: Diagnosis not present

## 2023-04-05 DIAGNOSIS — Z131 Encounter for screening for diabetes mellitus: Secondary | ICD-10-CM | POA: Diagnosis not present

## 2023-04-05 DIAGNOSIS — Z125 Encounter for screening for malignant neoplasm of prostate: Secondary | ICD-10-CM | POA: Diagnosis not present

## 2023-04-05 DIAGNOSIS — R635 Abnormal weight gain: Secondary | ICD-10-CM | POA: Diagnosis not present

## 2023-04-05 DIAGNOSIS — E7800 Pure hypercholesterolemia, unspecified: Secondary | ICD-10-CM | POA: Diagnosis not present

## 2023-04-08 DIAGNOSIS — Z1331 Encounter for screening for depression: Secondary | ICD-10-CM | POA: Diagnosis not present

## 2023-04-08 DIAGNOSIS — E7800 Pure hypercholesterolemia, unspecified: Secondary | ICD-10-CM | POA: Diagnosis not present

## 2023-04-08 DIAGNOSIS — Z1339 Encounter for screening examination for other mental health and behavioral disorders: Secondary | ICD-10-CM | POA: Diagnosis not present

## 2023-04-08 DIAGNOSIS — R635 Abnormal weight gain: Secondary | ICD-10-CM | POA: Diagnosis not present

## 2023-04-26 DIAGNOSIS — R768 Other specified abnormal immunological findings in serum: Secondary | ICD-10-CM | POA: Diagnosis not present

## 2023-04-26 DIAGNOSIS — E7800 Pure hypercholesterolemia, unspecified: Secondary | ICD-10-CM | POA: Diagnosis not present

## 2023-04-26 DIAGNOSIS — K219 Gastro-esophageal reflux disease without esophagitis: Secondary | ICD-10-CM | POA: Diagnosis not present

## 2023-04-26 DIAGNOSIS — Z6841 Body Mass Index (BMI) 40.0 and over, adult: Secondary | ICD-10-CM | POA: Diagnosis not present

## 2023-05-03 DIAGNOSIS — E7800 Pure hypercholesterolemia, unspecified: Secondary | ICD-10-CM | POA: Diagnosis not present

## 2023-05-03 DIAGNOSIS — Z6841 Body Mass Index (BMI) 40.0 and over, adult: Secondary | ICD-10-CM | POA: Diagnosis not present

## 2023-05-10 DIAGNOSIS — E7800 Pure hypercholesterolemia, unspecified: Secondary | ICD-10-CM | POA: Diagnosis not present

## 2023-05-10 DIAGNOSIS — Z6841 Body Mass Index (BMI) 40.0 and over, adult: Secondary | ICD-10-CM | POA: Diagnosis not present

## 2023-05-17 DIAGNOSIS — E7800 Pure hypercholesterolemia, unspecified: Secondary | ICD-10-CM | POA: Diagnosis not present

## 2023-05-17 DIAGNOSIS — E559 Vitamin D deficiency, unspecified: Secondary | ICD-10-CM | POA: Diagnosis not present

## 2023-05-17 DIAGNOSIS — Z6841 Body Mass Index (BMI) 40.0 and over, adult: Secondary | ICD-10-CM | POA: Diagnosis not present

## 2023-05-19 ENCOUNTER — Other Ambulatory Visit: Payer: Self-pay | Admitting: Gastroenterology

## 2023-05-19 DIAGNOSIS — R141 Gas pain: Secondary | ICD-10-CM | POA: Diagnosis not present

## 2023-05-19 DIAGNOSIS — R1033 Periumbilical pain: Secondary | ICD-10-CM

## 2023-05-19 DIAGNOSIS — K219 Gastro-esophageal reflux disease without esophagitis: Secondary | ICD-10-CM | POA: Diagnosis not present

## 2023-05-24 DIAGNOSIS — E559 Vitamin D deficiency, unspecified: Secondary | ICD-10-CM | POA: Diagnosis not present

## 2023-05-24 DIAGNOSIS — K219 Gastro-esophageal reflux disease without esophagitis: Secondary | ICD-10-CM | POA: Diagnosis not present

## 2023-05-24 DIAGNOSIS — Z6841 Body Mass Index (BMI) 40.0 and over, adult: Secondary | ICD-10-CM | POA: Diagnosis not present

## 2023-06-01 DIAGNOSIS — R768 Other specified abnormal immunological findings in serum: Secondary | ICD-10-CM | POA: Diagnosis not present

## 2023-06-01 DIAGNOSIS — Z6841 Body Mass Index (BMI) 40.0 and over, adult: Secondary | ICD-10-CM | POA: Diagnosis not present

## 2023-06-02 ENCOUNTER — Encounter: Payer: Self-pay | Admitting: Gastroenterology

## 2023-06-03 ENCOUNTER — Ambulatory Visit
Admission: RE | Admit: 2023-06-03 | Discharge: 2023-06-03 | Disposition: A | Discharge status: Home / Self Care | Payer: BC Managed Care – PPO | Source: Ambulatory Visit | Attending: Gastroenterology | Admitting: Gastroenterology

## 2023-06-03 DIAGNOSIS — R1033 Periumbilical pain: Secondary | ICD-10-CM | POA: Diagnosis not present

## 2023-06-03 MED ORDER — IOPAMIDOL (ISOVUE-300) INJECTION 61%
500.0000 mL | Freq: Once | INTRAVENOUS | Status: AC | PRN
  Administered 2023-06-03: 100 mL via INTRAVENOUS

## 2023-06-09 DIAGNOSIS — Z6841 Body Mass Index (BMI) 40.0 and over, adult: Secondary | ICD-10-CM | POA: Diagnosis not present

## 2023-06-09 DIAGNOSIS — K219 Gastro-esophageal reflux disease without esophagitis: Secondary | ICD-10-CM | POA: Diagnosis not present

## 2023-06-16 DIAGNOSIS — E7800 Pure hypercholesterolemia, unspecified: Secondary | ICD-10-CM | POA: Diagnosis not present

## 2023-06-16 DIAGNOSIS — E559 Vitamin D deficiency, unspecified: Secondary | ICD-10-CM | POA: Diagnosis not present

## 2023-06-16 DIAGNOSIS — R768 Other specified abnormal immunological findings in serum: Secondary | ICD-10-CM | POA: Diagnosis not present

## 2023-06-23 DIAGNOSIS — Z6841 Body Mass Index (BMI) 40.0 and over, adult: Secondary | ICD-10-CM | POA: Diagnosis not present

## 2023-06-23 DIAGNOSIS — E7800 Pure hypercholesterolemia, unspecified: Secondary | ICD-10-CM | POA: Diagnosis not present

## 2023-07-06 DIAGNOSIS — Z6841 Body Mass Index (BMI) 40.0 and over, adult: Secondary | ICD-10-CM | POA: Diagnosis not present

## 2023-07-06 DIAGNOSIS — E559 Vitamin D deficiency, unspecified: Secondary | ICD-10-CM | POA: Diagnosis not present

## 2023-07-06 DIAGNOSIS — E7800 Pure hypercholesterolemia, unspecified: Secondary | ICD-10-CM | POA: Diagnosis not present

## 2023-08-24 DIAGNOSIS — R0600 Dyspnea, unspecified: Secondary | ICD-10-CM | POA: Diagnosis not present

## 2023-08-24 DIAGNOSIS — E7800 Pure hypercholesterolemia, unspecified: Secondary | ICD-10-CM | POA: Diagnosis not present

## 2023-08-24 DIAGNOSIS — E785 Hyperlipidemia, unspecified: Secondary | ICD-10-CM | POA: Diagnosis not present

## 2023-08-24 DIAGNOSIS — Z79899 Other long term (current) drug therapy: Secondary | ICD-10-CM | POA: Diagnosis not present

## 2023-08-24 DIAGNOSIS — Q213 Tetralogy of Fallot: Secondary | ICD-10-CM | POA: Diagnosis not present

## 2024-06-04 ENCOUNTER — Ambulatory Visit: Admitting: Family Medicine

## 2024-06-07 ENCOUNTER — Ambulatory Visit: Admitting: Family Medicine

## 2024-06-07 ENCOUNTER — Encounter: Payer: Self-pay | Admitting: Family Medicine

## 2024-06-07 VITALS — BP 126/80 | HR 89 | Temp 98.1°F | Ht 65.5 in | Wt 311.6 lb

## 2024-06-07 DIAGNOSIS — L299 Pruritus, unspecified: Secondary | ICD-10-CM | POA: Diagnosis not present

## 2024-06-07 DIAGNOSIS — H9193 Unspecified hearing loss, bilateral: Secondary | ICD-10-CM

## 2024-06-07 DIAGNOSIS — J309 Allergic rhinitis, unspecified: Secondary | ICD-10-CM | POA: Diagnosis not present

## 2024-06-07 NOTE — Patient Instructions (Addendum)
 Thanks for coming in today.  I will refer you to both ear nose and throat specialist and audiologist to evaluate the hearing loss and decide on other changes in treatment.  I do not see a significant mount of wax in the ears today.  As we discussed some of the congestion and fullness feeling could be from eustachian tube dysfunction, or blocked canals into your ears from allergies.  Try restarting the fluticasone  or Flonase  nasal spray to see if that might help.  Okay to stay on Claritin or Zyrtec once per day as an antihistamine.  If any worsening symptoms let me know.  Will follow-up for physical in the next few months, but happy to see you sooner if needed for any acute issues.  Take care!

## 2024-06-07 NOTE — Progress Notes (Signed)
 Subjective:  Patient ID: Jonathan Cunningham, male    DOB: 07-07-1986  Age: 38 y.o. MRN: [id number]  CC:  Chief Complaint  Patient presents with   Hearing Problem    Bilateral loss of hearing. Notes slight itching    HPI Jonathan Cunningham presents for  Acute visit for above  Ear itching, decreased hearing Decreased hearing for awhile - for years. Saw audiologist in past - not enough to treat at that time with hearing aids,  no recent visit.  Thought to be due to prior ear infections. Feels like stopped up recently - past year.  No pain. Itching/irritation - uses qtip. Some discolored green, yellow or red wax on qtip at times. No bleeding.  Allergies flared recently. Using claritin daily recently. Saline ns. No recent flonase  use. Some increased congestion.   ENT eval - Dr. Rhoda in 2023. Cerumen impaction at that time.  No fever.  Overdue for physical, will schedule.  Declines flu vaccine today. History Patient Active Problem List   Diagnosis Date Noted   Abdominal bloating 06/09/2021   Acute anal fissure 06/09/2021   Bright red blood per rectum 06/09/2021   Change in bowel habit 06/09/2021   Gastroesophageal reflux disease without esophagitis 06/09/2021   Rectal bleeding 06/09/2021   Right upper quadrant pain 06/09/2021   Obesity 11/14/2019   Diarrhea 07/19/2019   Testicular mass 04/18/2019   Pulmonary valve stenosis 10/22/2017   Tetralogy of Fallot 02/09/2015   Tetralogy of Fallot s/p repair 02/14/2012   Past Medical History:  Diagnosis Date   Diarrhea 07/19/2019   GERD (gastroesophageal reflux disease)    Heart murmur    Past Surgical History:  Procedure Laterality Date   CARDIAC SURGERY     No Known Allergies Prior to Admission medications   Medication Sig Start Date End Date Taking? Authorizing Provider  acetaminophen  (TYLENOL ) 325 MG tablet Take by mouth.    [provider]  acetaminophen  (TYLENOL ) 500 MG tablet Take 1 tablet (500 mg total) by mouth  every 6 (six) hours as needed. 11/16/14   Gretta Ozell CROME, PA-C  Ascorbic Acid (VITAMIN C) 1000 MG tablet Take 1,000 mg by mouth daily.    [provider]  azithromycin  (ZITHROMAX ) 500 MG tablet Take 1 tablet (500 mg total) by mouth daily. Patient not taking: Reported on 08/24/2022 03/01/22   Levora Reyes SAUNDERS, MD  cetirizine (ZYRTEC) 10 MG tablet Take 10 mg by mouth daily as needed for allergies.  Patient not taking: Reported on 08/24/2022    [provider]  dicyclomine  (BENTYL ) 10 MG capsule Take 1 capsule (10 mg total) by mouth 4 (four) times daily -  before meals and at bedtime. Patient not taking: Reported on 08/24/2022 03/01/22   Levora Reyes SAUNDERS, MD  famotidine (PEPCID) 40 MG tablet Take 40 mg by mouth 2 (two) times daily. Patient not taking: Reported on 11/11/2021 10/11/19   [provider]  famotidine (PEPCID) 40 MG tablet Take by mouth. 10/11/19   [provider]  fluticasone  (FLONASE ) 50 MCG/ACT nasal spray Place 2 sprays into both nostrils daily. Patient not taking: Reported on 03/01/2022 09/17/20   Just, Kelsea J, FNP  fluticasone  (FLONASE ) 50 MCG/ACT nasal spray 50 sprays daily as needed for allergies. 02/12/13   [provider]  Lidocaine , Anorectal, 5 % GEL Apply pea-sized amount to the perianal skin up to 3 times per day as needed. Patient not taking: Reported on 08/24/2022 03/01/22   Levora Reyes SAUNDERS, MD  magnesium oxide (MAG-OX) 400 (240 Mg) MG tablet Take 400 mg by mouth daily.    [provider]  meloxicam  (MOBIC ) 15 MG tablet Take 1 tablet (15 mg total) by mouth daily. Patient not taking: Reported on 11/11/2021 06/09/21   Hyatt, Max T, DPM  methylPREDNISolone  (MEDROL  DOSEPAK) 4 MG TBPK tablet 6 day dose pack - take as directed Patient not taking: Reported on 11/11/2021 06/09/21   Hyatt, Max T, DPM  Omega-3 Fatty Acids (FISH OIL) 1200 MG CAPS Take by mouth.    [provider]  ondansetron  (ZOFRAN -ODT) 4 MG disintegrating tablet  Take 1 tablet (4 mg total) by mouth every 8 (eight) hours as needed for nausea or vomiting. Patient not taking: Reported on 08/24/2022 11/11/21   Kip Ade, NP  promethazine -dextromethorphan (PROMETHAZINE -DM) 6.25-15 MG/5ML syrup Take 5 mLs by mouth 4 (four) times daily as needed. 08/24/22   Douglass Kenney NOVAK, FNP   Social History   Socioeconomic History   Marital status: Single    Spouse name: Not on file   Number of children: Not on file   Years of education: Not on file   Highest education level: Not on file  Occupational History   Not on file  Tobacco Use   Smoking status: Never   Smokeless tobacco: Never  Substance and Sexual Activity   Alcohol use: No    Alcohol/week: 0.0 standard drinks of alcohol   Drug use: No   Sexual activity: Not on file  Other Topics Concern   Not on file  Social History Narrative   Not on file   Social Drivers of Health   Financial Resource Strain: Not on file  Food Insecurity: Not on file  Transportation Needs: Not on file  Physical Activity: Insufficiently Active (06/07/2024)   Exercise Vital Sign    Days of Exercise per Week: 1 day    Minutes of Exercise per Session: 30 min  Stress: No Stress Concern Present (06/07/2024)   [employer] of Occupational Health - Occupational Stress Questionnaire    Feeling of Stress: Not at all  Social Connections: Not on file  Intimate Partner Violence: Not on file    Review of Systems Per HPI.   Objective:   Vitals:   06/07/24 0759  BP: 126/80  Pulse: 89  Temp: 98.1 F (36.7 C)  SpO2: 98%  Weight: (!) 311 lb 9.6 oz (141.3 kg)  Height: 5' 5.5 (1.664 m)     Physical Exam Vitals reviewed.  Constitutional:      Appearance: He is well-developed.  HENT:     Head: Normocephalic and atraumatic.     Right Ear: External ear normal. There is no impacted cerumen.     Left Ear: External ear normal. There is no impacted cerumen.     Ears:     Comments: Small amount of yellow cerumen in  canals but nonobstructive.  No pain with traction of pinna.  External ears normal.  Both right and left TMs appear to have some scarring, clear fluid at base.  No apparent retraction or erythema, no injection.  No perforation.  Canals without edema or erythema.    Nose: No rhinorrhea.     Comments: Sinuses nontender.    Mouth/Throat:     Mouth: Mucous membranes are moist.     Pharynx: No oropharyngeal exudate or posterior oropharyngeal erythema.  Eyes:     Conjunctiva/sclera: Conjunctivae normal.     Pupils: Pupils are equal, round, and reactive to light.  Cardiovascular:  Rate and Rhythm: Normal rate and regular rhythm.     Heart sounds: Murmur heard.  Pulmonary:     Effort: Pulmonary effort is normal.     Breath sounds: Normal breath sounds. No wheezing, rhonchi or rales.  Abdominal:     Palpations: Abdomen is soft.     Tenderness: There is no abdominal tenderness.  Musculoskeletal:     Cervical back: Neck supple.  Lymphadenopathy:     Cervical: No cervical adenopathy.  Skin:    General: Skin is warm and dry.     Findings: No rash.  Neurological:     Mental Status: He is alert and oriented to person, place, and time.  Psychiatric:        Behavior: Behavior normal.        Assessment & Plan:  Jonathan Cunningham is a 38 y.o. male . Decreased hearing of both ears - Plan: Ambulatory referral to Audiology, Ambulatory referral to ENT  Itching of ear  Allergic rhinitis, unspecified seasonality, unspecified trigger  Hearing loss, ear symptoms could be multifactorial with some component of serous otitis, eustachian tube dysfunction, prior scarring with infection, does not appear to have significant cerumen or cerumen impaction.  Recommended restarting Flonase  nasal spray, refer back to audiology as well as ENT for further testing and treatment.   No orders of the defined types were placed in this encounter.  Patient Instructions  Thanks for coming in today.  I will refer you  to both ear nose and throat specialist and audiologist to evaluate the hearing loss and decide on other changes in treatment.  I do not see a significant mount of wax in the ears today.  As we discussed some of the congestion and fullness feeling could be from eustachian tube dysfunction, or blocked canals into your ears from allergies.  Try restarting the fluticasone  or Flonase  nasal spray to see if that might help.  Okay to stay on Claritin or Zyrtec once per day as an antihistamine.  If any worsening symptoms let me know.  Will follow-up for physical in the next few months, but happy to see you sooner if needed for any acute issues.  Take care!    Signed,   Reyes Pines, MD [facility] 06/07/24 8:28 AM

## 2024-09-05 ENCOUNTER — Ambulatory Visit: Payer: Self-pay

## 2024-09-05 NOTE — Telephone Encounter (Signed)
 FYI Only or Action Required?: FYI only for provider: Home care advise provided.  Patient was last seen in primary care on 06/07/2024 by Levora Reyes SAUNDERS, MD.  Called Nurse Triage reporting Influenza like symptoms.  Symptoms began 2 days ago.  Interventions attempted: Rest, hydration, or home remedies.  Symptoms are: unchanged.  Triage Disposition: Home Care  Patient/caregiver understands and will follow disposition?: Yes   Reason for Disposition  [1] Probable mild influenza (no fever) or a common cold, with no complications AND [2] NOT HIGH RISK  Answer Assessment - Initial Assessment Questions Additional info: Test both Monday and Tuesday for Covid 19 and flu both have resulted negative. He does have prescription for Tamiflu  available and wondering if he should take it, advised since prescribed to him and he is having upper respiratory symptoms that started <48 hours ok to take as prescribed. Patient will call back if symptoms persist, worsen, or develops new symptoms.   1. SYMPTOMS: What is your main symptom or concern? (e.g., cough, fever, shortness of breath, muscle aches)     Body aches 2. ONSET: When did the symptoms start?      09/03/24 3. COUGH: Do you have a cough? If Yes, ask: How bad is the cough?       mild 4. FEVER: Do you have a fever? If Yes, ask: What is your temperature, how was it measured, and when did it start?     Denies but had chills on Monday night  5. BREATHING DIFFICULTY: Are you having any difficulty breathing? (e.g., normal; shortness of breath, wheezing, unable to speak)      denies 6. BETTER-SAME-WORSE: Are you getting better, staying the same or getting worse compared to yesterday?  If getting worse, ask, In what way?     Same 7. OTHER SYMPTOMS: Do you have any other symptoms?  (e.g., chills, fatigue, headache, loss of smell or taste, muscle pain, sore throat)     Chills, body aches, non productive cough, nasal drainage yellow  and getting brown, mild headache  8. INFLUENZA EXPOSURE: Was there any known exposure to influenza (flu) before the symptoms began?      None known-people at work are sick 9. INFLUENZA SUSPECTED: Why do you think you have influenza? (e.g., positive flu self-test at home, symptoms after exposure).     Widespread upper respiratory illnesses at his work  10. INFLUENZA VACCINE: Have you had the flu vaccine? If Yes, ask: When did you last get it?       no 11. HIGH RISK FOR COMPLICATIONS: Do you have any chronic medical problems? (e.g., asthma, heart or lung disease, obesity, weak immune system)  Protocols used: Influenza (Flu) Suspected-A-AH Copied from CRM [id number]. Topic: Clinical - Medical Advice >> Sep 05, 2024 11:13 AM Ahlexyia S wrote: Reason for CRM: Pt called in stating that he took 2 at home flu test but they both came back negative even though he is having symptoms. Pt is unsure on his next steps. Pt is requesting a callback.

## 2024-09-05 NOTE — Telephone Encounter (Signed)
 Please see message below and triage note  Test both Monday and Tuesday for Covid 19 and flu both have resulted negative. He does have prescription for Tamiflu  available and wondering if he should take it, advised since prescribed to him and he is having upper respiratory symptoms that started <48 hours ok to take as prescribed. Patient will call back if symptoms persist, worsen, or develops new symptoms.

## 2024-09-07 ENCOUNTER — Telehealth: Payer: Self-pay

## 2024-09-07 ENCOUNTER — Ambulatory Visit
Admission: RE | Admit: 2024-09-07 | Discharge: 2024-09-07 | Disposition: A | Discharge status: Home / Self Care | Source: Ambulatory Visit | Attending: Family Medicine | Admitting: Family Medicine

## 2024-09-07 ENCOUNTER — Ambulatory Visit: Admitting: Family Medicine

## 2024-09-07 ENCOUNTER — Encounter: Payer: Self-pay | Admitting: Family Medicine

## 2024-09-07 VITALS — BP 110/80 | HR 109 | Temp 98.1°F | Resp 18 | Ht 65.5 in | Wt 312.0 lb

## 2024-09-07 DIAGNOSIS — R000 Tachycardia, unspecified: Secondary | ICD-10-CM | POA: Diagnosis not present

## 2024-09-07 DIAGNOSIS — R059 Cough, unspecified: Secondary | ICD-10-CM | POA: Diagnosis not present

## 2024-09-07 DIAGNOSIS — M899 Disorder of bone, unspecified: Secondary | ICD-10-CM

## 2024-09-07 DIAGNOSIS — J22 Unspecified acute lower respiratory infection: Secondary | ICD-10-CM

## 2024-09-07 DIAGNOSIS — R141 Gas pain: Secondary | ICD-10-CM | POA: Insufficient documentation

## 2024-09-07 DIAGNOSIS — R1033 Periumbilical pain: Secondary | ICD-10-CM | POA: Insufficient documentation

## 2024-09-07 LAB — CBC
HCT: 43.4 % (ref 39.0–52.0)
Hemoglobin: 14.6 g/dL (ref 13.0–17.0)
MCHC: 33.6 g/dL (ref 30.0–36.0)
MCV: 85.9 fl (ref 78.0–100.0)
Platelets: 296 K/uL (ref 150.0–400.0)
RBC: 5.05 Mil/uL (ref 4.22–5.81)
RDW: 13.8 % (ref 11.5–15.5)
WBC: 12 K/uL — ABNORMAL HIGH (ref 4.0–10.5)

## 2024-09-07 MED ORDER — AMOXICILLIN-POT CLAVULANATE 875-125 MG PO TABS: 1.0000 | ORAL_TABLET | Freq: Two times a day (BID) | ORAL | 0 refills | Status: AC

## 2024-09-07 MED ORDER — GUAIFENESIN-CODEINE 100-10 MG/5ML PO SOLN: 5.0000 mL | Freq: Every evening | ORAL | 0 refills | Status: AC | PRN

## 2024-09-07 NOTE — Telephone Encounter (Signed)
 Copied from CRM [id number]. Topic: Clinical - Lab/Test Results >> Sep 07, 2024  1:18 PM Chasity T wrote: Reason for CRM: patient is calling requesting Dr Levora to call him due to some concerns about his x-rays.   Phone number: [phone]

## 2024-09-07 NOTE — Telephone Encounter (Signed)
 Patient has some concerns about xray from today.

## 2024-09-07 NOTE — Progress Notes (Signed)
 "  Subjective:  Patient ID: Jonathan Cunningham, male    DOB: 03/31/1986  Age: 39 y.o. MRN: [id number]  CC:  Chief Complaint  Patient presents with   Acute Visit    Tested negative 2 twice for COVID/FLU Wednesday. Sx started Monday. Irritated throat that makes him cough. Feels like congestion is in his chest. Body aches. Chills. HA. Tamiflu  was given by a DR online. Started Tamiflu  a couple days ago. He is on the 3rd day. Also given a nasal spray and tessalon  pearls. Saline is making his nose bleed.     HPI Jonathan Cunningham presents for   Acute visit for above  Cough, chest congestion Symptoms started 4 days ago.  Cough, chest congestion with bodyaches, chills, headache. Felt feverish first day only. He was treated by an online doctor with Tamiflu  4 days ago.  but has had 2 separate tests for COVID/flu that were negative 4 and 2 days ago. Some sick contacts - friends - unknown illness. Some sick contacts at work.  Some looser stools at times. Denies dyspnea. Feels like settling in chest yesterday - more chest cough. No dyspnea.  Tx: tamiflu , ipratropium (has caused some nosebleed), tessalon  perles. Feels like not getting better.    History Patient Active Problem List   Diagnosis Date Noted   Flatulence, eructation and gas pain 09/07/2024   Periumbilical pain 09/07/2024   Impacted cerumen of right ear 06/21/2022   Abdominal bloating 06/09/2021   Acute anal fissure 06/09/2021   Bright red blood per rectum 06/09/2021   Change in bowel habit 06/09/2021   Gastroesophageal reflux disease without esophagitis 06/09/2021   Rectal bleeding 06/09/2021   Right upper quadrant pain 06/09/2021   Obesity 11/14/2019   Diarrhea 07/19/2019   Testicular mass 04/18/2019   Pulmonary valve stenosis 10/22/2017   Tetralogy of Fallot 02/09/2015   Tetralogy of Fallot s/p repair 02/14/2012   Past Medical History:  Diagnosis Date   Diarrhea 07/19/2019   GERD (gastroesophageal reflux disease)    Heart  murmur    Past Surgical History:  Procedure Laterality Date   CARDIAC SURGERY     Allergies[1] Prior to Admission medications  Medication Sig Start Date End Date Taking? Authorizing Provider  acetaminophen  (TYLENOL ) 500 MG tablet Take 1 tablet (500 mg total) by mouth every 6 (six) hours as needed. 11/16/14  Yes Gretta Ozell CROME, PA-C  benzonatate  (TESSALON ) 200 MG capsule Take 200 mg by mouth 3 (three) times daily as needed. 09/03/24  Yes [provider]  famotidine (PEPCID) 40 MG tablet Take by mouth. 10/11/19  Yes [provider]  ipratropium (ATROVENT) 0.06 % nasal spray Place 2 sprays into both nostrils 3 (three) times daily. 09/03/24  Yes [provider]  magnesium oxide (MAG-OX) 400 (240 Mg) MG tablet Take 400 mg by mouth daily.   Yes [provider]  oseltamivir  (TAMIFLU ) 75 MG capsule Take 75 mg by mouth 2 (two) times daily. 09/04/24  Yes [provider]  acetaminophen  (TYLENOL ) 325 MG tablet Take by mouth. Patient not taking: Reported on 09/07/2024    [provider]  Ascorbic Acid (VITAMIN C) 1000 MG tablet Take 1,000 mg by mouth daily. Patient not taking: Reported on 09/07/2024    [provider]  azithromycin  (ZITHROMAX ) 500 MG tablet Take 1 tablet (500 mg total) by mouth daily. Patient not taking: Reported on 09/07/2024 03/01/22   Levora Reyes SAUNDERS, MD  cetirizine (ZYRTEC) 10 MG tablet Take 10 mg by mouth daily as  needed for allergies.  Patient not taking: Reported on 09/07/2024    [provider]  dicyclomine  (BENTYL ) 10 MG capsule Take 1 capsule (10 mg total) by mouth 4 (four) times daily -  before meals and at bedtime. Patient not taking: Reported on 09/07/2024 03/01/22   Levora Reyes SAUNDERS, MD  famotidine (PEPCID) 40 MG tablet Take 40 mg by mouth 2 (two) times daily. Patient not taking: Reported on 09/07/2024 10/11/19   [provider]  fluticasone  (FLONASE ) 50 MCG/ACT nasal spray Place 2 sprays into both  nostrils daily. Patient not taking: Reported on 09/07/2024 09/17/20   Just, Kelsea J, FNP  fluticasone  (FLONASE ) 50 MCG/ACT nasal spray 50 sprays daily as needed for allergies. Patient not taking: Reported on 09/07/2024 02/12/13   [provider]  Lidocaine , Anorectal, 5 % GEL Apply pea-sized amount to the perianal skin up to 3 times per day as needed. Patient not taking: Reported on 09/07/2024 03/01/22   Levora Reyes SAUNDERS, MD  meloxicam  (MOBIC ) 15 MG tablet Take 1 tablet (15 mg total) by mouth daily. Patient not taking: Reported on 09/07/2024 06/09/21   Verta Jude T, DPM  methylPREDNISolone  (MEDROL  DOSEPAK) 4 MG TBPK tablet 6 day dose pack - take as directed Patient not taking: Reported on 09/07/2024 06/09/21   Hyatt, Max T, DPM  Omega-3 Fatty Acids (FISH OIL) 1200 MG CAPS Take by mouth. Patient not taking: Reported on 09/07/2024    [provider]  ondansetron  (ZOFRAN -ODT) 4 MG disintegrating tablet Take 1 tablet (4 mg total) by mouth every 8 (eight) hours as needed for nausea or vomiting. Patient not taking: Reported on 09/07/2024 11/11/21   Kip Ade, NP  promethazine -dextromethorphan (PROMETHAZINE -DM) 6.25-15 MG/5ML syrup Take 5 mLs by mouth 4 (four) times daily as needed. Patient not taking: Reported on 09/07/2024 08/24/22   Douglass Kenney NOVAK, FNP   Social History   Socioeconomic History   Marital status: Single    Spouse name: Not on file   Number of children: Not on file   Years of education: Not on file   Highest education level: Not on file  Occupational History   Not on file  Tobacco Use   Smoking status: Never   Smokeless tobacco: Never  Substance and Sexual Activity   Alcohol use: No    Alcohol/week: 0.0 standard drinks of alcohol   Drug use: No   Sexual activity: Not on file  Other Topics Concern   Not on file  Social History Narrative   Not on file   Social Drivers of Health   Tobacco Use: Low Risk (09/07/2024)   Patient History    Smoking Tobacco Use: Never     Smokeless Tobacco Use: Never    Passive Exposure: Not on file  Financial Resource Strain: Not on file  Food Insecurity: Not on file  Transportation Needs: Not on file  Physical Activity: Insufficiently Active (06/07/2024)   Exercise Vital Sign    Days of Exercise per Week: 1 day    Minutes of Exercise per Session: 30 min  Stress: No Stress Concern Present (06/07/2024)   [employer] - Occupational Stress Questionnaire    Feeling of Stress: Not at all  Social Connections: Not on file  Intimate Partner Violence: Not on file  Depression (PHQ2-9): Low Risk (08/24/2022)   Depression (PHQ2-9)    PHQ-2 Score: 0  Alcohol Screen: Not on file  Housing: Not on file  Utilities: Not on file  Health Literacy: Not on file  Review of Systems  Per HPI Objective:   Vitals:   09/07/24 0830  BP: 110/80  Pulse: (!) 109  Resp: 18  Temp: 98.1 F (36.7 C)  TempSrc: Temporal  SpO2: 96%  Weight: (!) 312 lb (141.5 kg)  Height: 5' 5.5 (1.664 m)     Physical Exam Vitals reviewed.  Constitutional:      Appearance: He is well-developed.  HENT:     Head: Normocephalic and atraumatic.     Right Ear: Tympanic membrane, ear canal and external ear normal.     Left Ear: Tympanic membrane, ear canal and external ear normal.     Nose: No rhinorrhea.     Mouth/Throat:     Pharynx: No oropharyngeal exudate or posterior oropharyngeal erythema.  Eyes:     Conjunctiva/sclera: Conjunctivae normal.     Pupils: Pupils are equal, round, and reactive to light.  Cardiovascular:     Rate and Rhythm: Normal rate and regular rhythm.     Heart sounds: Normal heart sounds. No murmur heard. Pulmonary:     Effort: Pulmonary effort is normal.     Breath sounds: Normal breath sounds. No wheezing, rhonchi or rales.  Abdominal:     Palpations: Abdomen is soft.     Tenderness: There is no abdominal tenderness.  Musculoskeletal:     Cervical back: Neck supple.  Lymphadenopathy:      Cervical: No cervical adenopathy.  Skin:    General: Skin is warm and dry.     Findings: No rash.  Neurological:     Mental Status: He is alert and oriented to person, place, and time.  Psychiatric:        Behavior: Behavior normal.     Results for orders placed or performed in visit on 09/07/24  CBC   Collection Time: 09/07/24  9:11 AM  Result Value Ref Range   WBC 12.0 (H) 4.0 - 10.5 K/uL   RBC 5.05 4.22 - 5.81 Mil/uL   Platelets 296.0 150.0 - 400.0 K/uL   Hemoglobin 14.6 13.0 - 17.0 g/dL   HCT 56.5 60.9 - 47.9 %   MCV 85.9 78.0 - 100.0 fl   MCHC 33.6 30.0 - 36.0 g/dL   RDW 86.1 88.4 - 84.4 %   DG Chest 2 View Result Date: 09/07/2024 EXAM: 2 VIEW(S) XRAY OF THE CHEST 09/07/2024 09:47:54 AM COMPARISON: None available. CLINICAL HISTORY: cough, chest congestion, tachycardia FINDINGS: LUNGS AND PLEURA: No focal pulmonary opacity. No pleural effusion. No pneumothorax. HEART AND MEDIASTINUM: No acute abnormality of the cardiac and mediastinal silhouettes. BONES AND SOFT TISSUES: Median sternotomy wires noted. 2.5 cm mixed sclerotic and lucent lesion in left humeral neck. IMPRESSION: 1. No acute cardiopulmonary abnormality. 2. 2.5 cm mixed sclerotic and lucent lesion in the left humeral neck, indeterminate; recommend dedicated left shoulder/humerus radiographs for further evaluation. Electronically signed by: Donnice Mania MD 09/07/2024 11:02 AM EST RP Workstation: [id number]      Assessment & Plan:  Jonathan Cunningham is a 39 y.o. male . Cough, unspecified type - Plan: CBC, DG Chest 2 View, guaiFENesin -codeine 100-10 MG/5ML syrup  Tachycardia - Plan: CBC, DG Chest 2 View  LRTI (lower respiratory tract infection) - Plan: CBC, DG Chest 2 View, amoxicillin -clavulanate (AUGMENTIN) 875-125 MG tablet  Suspected viral illness, on Tamiflu  but negative flu and COVID testing at home.  Likely other virus.  However worsening symptoms with more chest congestion, he is tachycardic in the office.   Nontoxic-appearing.  O2 sat reassuring, exam reassuring.  Check  CBC, chest x-ray, symptomatic care discussed, codeine cough syrup added if needed at bedtime.  Stop ipratropium if contributing to nosebleeds, saline nasal spray instead.  Augmentin if not improved in the next few days or depending on chest x-ray results.  If side effects/risks discussed, RTC/ER precautions given.   12:54 PM X-ray results noted.  No acute cardiopulmonary process but with leukocytosis and early chest symptoms we will go ahead and have him start Augmentin for possible early community-acquired pneumonia versus bronchitis.  Noted abnormality in the left humeral neck, will order dedicated shoulder x-ray for him to complete in the next week, and then can decide on further imaging or treatment. Called patient with this plan.  Understanding expressed and all questions answered.  Meds ordered this encounter  Medications   guaiFENesin -codeine 100-10 MG/5ML syrup    Sig: Take 5-10 mLs by mouth at bedtime as needed for cough.    Dispense:  120 mL    Refill:  0   amoxicillin -clavulanate (AUGMENTIN) 875-125 MG tablet    Sig: Take 1 tablet by mouth 2 (two) times daily.    Dispense:  20 tablet    Refill:  0   Patient Instructions  As we discussed I do suspect that you have a virus, and may take a few more days for that to start to improve.  However with the symptoms in the chest, and elevated heart rate today I will do some blood work and a chest x-ray.  I did print the antibiotic Augmentin if we see any sign of pneumonia on the chest x-ray or if that cough and chest congestion is not improving through the weekend.  As we discussed there are risks of antibiotics including diarrhea that may worsen. For the cough, okay to use Mucinex  over-the-counter or the Tessalon  Perles during the day, codeine cough syrup was prescribed for bedtime. Continue to drink plenty of fluids.  Sore throat lozenges as needed. If the ipratropium nasal spray  is causing nosebleeds, then I recommend switching to saline nasal spray. Hope you feel better soon!  Return to the clinic or go to the nearest emergency room if any of your symptoms worsen or new symptoms occur.   Please have x-ray at location below [facility] or xray: Walk in 8:30-4:30 during weekdays, no appointment needed [address]  [place], [place] [address]     Signed,   Reyes Pines, MD [facility], [facility] 09/07/2024 9:10 AM      [1] No Known Allergies  "

## 2024-09-07 NOTE — Patient Instructions (Addendum)
 As we discussed I do suspect that you have a virus, and may take a few more days for that to start to improve.  However with the symptoms in the chest, and elevated heart rate today I will do some blood work and a chest x-ray.  I did print the antibiotic Augmentin if we see any sign of pneumonia on the chest x-ray or if that cough and chest congestion is not improving through the weekend.  As we discussed there are risks of antibiotics including diarrhea that may worsen. For the cough, okay to use Mucinex  over-the-counter or the Tessalon  Perles during the day, codeine cough syrup was prescribed for bedtime. Continue to drink plenty of fluids.  Sore throat lozenges as needed. If the ipratropium nasal spray is causing nosebleeds, then I recommend switching to saline nasal spray. Hope you feel better soon!  Return to the clinic or go to the nearest emergency room if any of your symptoms worsen or new symptoms occur.   Please have x-ray at location below [facility] or xray: Walk in 8:30-4:30 during weekdays, no appointment needed [address]  [place], [place] [address]

## 2024-09-07 NOTE — Telephone Encounter (Signed)
 Called patient to discuss results and additional questions. Clarified the imaging results and plan for humerus abnormality.  All questions answered.

## 2024-09-08 ENCOUNTER — Ambulatory Visit: Payer: Self-pay | Admitting: Family Medicine

## 2024-09-08 DIAGNOSIS — M899 Disorder of bone, unspecified: Secondary | ICD-10-CM

## 2024-10-09 ENCOUNTER — Ambulatory Visit
Admission: RE | Admit: 2024-10-09 | Discharge: 2024-10-09 | Disposition: A | Source: Ambulatory Visit | Attending: Family Medicine | Admitting: Family Medicine

## 2024-10-09 DIAGNOSIS — M899 Disorder of bone, unspecified: Secondary | ICD-10-CM

## 2024-10-09 MED ORDER — GADOPICLENOL 0.5 MMOL/ML IV SOLN
10.0000 mL | Freq: Once | INTRAVENOUS | Status: AC | PRN
  Administered 2024-10-09: 10 mL via INTRAVENOUS

## 2024-10-10 ENCOUNTER — Telehealth: Payer: Self-pay

## 2024-10-10 NOTE — Telephone Encounter (Signed)
 Please see imaging section, MRI has been completed but not yet read.  It sometimes can take a few days for those results to come through.  I will keep an eye out on my inbox and once I have the reading we will contact the patient.  Thanks.

## 2024-10-10 NOTE — Telephone Encounter (Signed)
 Patient is calling about his MRI results.   Copied from CRM [id number]. Topic: Clinical - Lab/Test Results >> Oct 10, 2024 10:10 AM Nessti S wrote: Reason for CRM: pt called about mri results. Adv that results have not been seen by pcp. He would like a call back soon as pcp sees results

## 2024-10-10 NOTE — Telephone Encounter (Signed)
 noted

## 2024-10-11 ENCOUNTER — Ambulatory Visit: Payer: Self-pay | Admitting: Family Medicine

## 2024-10-12 ENCOUNTER — Telehealth: Payer: Self-pay

## 2024-10-12 DIAGNOSIS — M899 Disorder of bone, unspecified: Secondary | ICD-10-CM

## 2024-10-12 NOTE — Addendum Note (Signed)
 Addended by: Kelani Robart R on: 10/12/2024 01:18 PM   Modules accepted: Orders

## 2024-10-12 NOTE — Telephone Encounter (Signed)
 Patient has been called back we reviewed imaging result with him and notes he would like to see specialist to be safe

## 2024-10-12 NOTE — Telephone Encounter (Signed)
 Copied from CRM [id number]. Topic: Clinical - Lab/Test Results >> Oct 12, 2024 11:30 AM Berneda FALCON wrote: Reason for CRM: Patient would like a callback to discuss MRI results please. He has some additional questions.   Patient callback is [phone] (home)

## 2024-10-12 NOTE — Telephone Encounter (Signed)
 Called patient. I will refer him to ortho to review imaging and plan. All questions answered.

## 2024-10-22 ENCOUNTER — Encounter: Admitting: Family Medicine
